# Patient Record
Sex: Female | Born: 1965 | Race: White | Hispanic: No | Smoking: Never smoker
Health system: Southern US, Community
[De-identification: ages and names within clinical notes are randomized; demographics above are authoritative.]

## PROBLEM LIST (undated history)

## (undated) DIAGNOSIS — I10 Essential (primary) hypertension: Secondary | ICD-10-CM

## (undated) DIAGNOSIS — N809 Endometriosis, unspecified: Secondary | ICD-10-CM

## (undated) DIAGNOSIS — R7303 Prediabetes: Secondary | ICD-10-CM

## (undated) DIAGNOSIS — F329 Major depressive disorder, single episode, unspecified: Secondary | ICD-10-CM

## (undated) DIAGNOSIS — G43909 Migraine, unspecified, not intractable, without status migrainosus: Secondary | ICD-10-CM

## (undated) DIAGNOSIS — E781 Pure hyperglyceridemia: Secondary | ICD-10-CM

## (undated) HISTORY — DX: Major depressive disorder, single episode, unspecified: F32.9

## (undated) HISTORY — DX: Pure hyperglyceridemia: E78.1

## (undated) HISTORY — DX: Endometriosis, unspecified: N80.9

## (undated) HISTORY — DX: Prediabetes: R73.03

---
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
## 1995-01-23 DIAGNOSIS — N809 Endometriosis, unspecified: Secondary | ICD-10-CM

## 1995-01-23 HISTORY — DX: Endometriosis, unspecified: N80.9

## 1995-01-23 HISTORY — PX: LAPAROSCOPIC ABDOMINAL EXPLORATION: SHX6249

## 1997-01-22 DIAGNOSIS — I10 Essential (primary) hypertension: Secondary | ICD-10-CM

## 1997-01-22 HISTORY — DX: Essential (primary) hypertension: I10

## 1997-03-02 ENCOUNTER — Encounter (HOSPITAL_COMMUNITY): Admission: RE | Admit: 1997-03-02 | Discharge: 1997-03-22 | Payer: Self-pay | Admitting: Obstetrics and Gynecology

## 1997-03-19 ENCOUNTER — Inpatient Hospital Stay (HOSPITAL_COMMUNITY): Admission: AD | Admit: 1997-03-19 | Discharge: 1997-03-22 | Payer: Self-pay | Admitting: Obstetrics and Gynecology

## 1997-10-26 ENCOUNTER — Other Ambulatory Visit: Admission: RE | Admit: 1997-10-26 | Discharge: 1997-10-26 | Payer: Self-pay | Admitting: Obstetrics and Gynecology

## 1998-01-22 DIAGNOSIS — I10 Essential (primary) hypertension: Secondary | ICD-10-CM | POA: Insufficient documentation

## 1998-05-14 ENCOUNTER — Emergency Department (HOSPITAL_COMMUNITY): Admission: EM | Admit: 1998-05-14 | Discharge: 1998-05-15 | Payer: Self-pay | Admitting: Emergency Medicine

## 1998-05-17 ENCOUNTER — Encounter: Payer: Self-pay | Admitting: Obstetrics and Gynecology

## 1998-05-17 ENCOUNTER — Ambulatory Visit (HOSPITAL_COMMUNITY): Admission: RE | Admit: 1998-05-17 | Discharge: 1998-05-17 | Payer: Self-pay | Admitting: Obstetrics and Gynecology

## 1998-06-17 ENCOUNTER — Ambulatory Visit (HOSPITAL_COMMUNITY): Admission: RE | Admit: 1998-06-17 | Discharge: 1998-06-17 | Payer: Self-pay | Admitting: Obstetrics and Gynecology

## 1999-01-14 ENCOUNTER — Emergency Department (HOSPITAL_COMMUNITY): Admission: EM | Admit: 1999-01-14 | Discharge: 1999-01-15 | Payer: Self-pay | Admitting: *Deleted

## 2000-01-23 HISTORY — PX: APPENDECTOMY: SHX54

## 2000-02-26 ENCOUNTER — Encounter: Admission: RE | Admit: 2000-02-26 | Discharge: 2000-02-26 | Payer: Self-pay | Admitting: *Deleted

## 2000-02-26 ENCOUNTER — Encounter: Payer: Self-pay | Admitting: *Deleted

## 2000-02-27 ENCOUNTER — Other Ambulatory Visit: Admission: RE | Admit: 2000-02-27 | Discharge: 2000-02-27 | Payer: Self-pay | Admitting: Obstetrics and Gynecology

## 2000-02-28 ENCOUNTER — Encounter (INDEPENDENT_AMBULATORY_CARE_PROVIDER_SITE_OTHER): Payer: Self-pay | Admitting: Specialist

## 2000-02-29 ENCOUNTER — Encounter: Payer: Self-pay | Admitting: Obstetrics and Gynecology

## 2000-02-29 ENCOUNTER — Inpatient Hospital Stay (HOSPITAL_COMMUNITY): Admission: AD | Admit: 2000-02-29 | Discharge: 2000-03-01 | Payer: Self-pay | Admitting: Obstetrics and Gynecology

## 2000-06-21 ENCOUNTER — Ambulatory Visit (HOSPITAL_COMMUNITY): Admission: RE | Admit: 2000-06-21 | Discharge: 2000-06-21 | Payer: Self-pay | Admitting: Obstetrics and Gynecology

## 2000-06-21 ENCOUNTER — Encounter: Payer: Self-pay | Admitting: Obstetrics and Gynecology

## 2000-08-18 ENCOUNTER — Observation Stay (HOSPITAL_COMMUNITY): Admission: EM | Admit: 2000-08-18 | Discharge: 2000-08-19 | Payer: Self-pay | Admitting: Emergency Medicine

## 2000-08-18 ENCOUNTER — Encounter: Payer: Self-pay | Admitting: Emergency Medicine

## 2001-04-10 ENCOUNTER — Encounter: Payer: Self-pay | Admitting: Emergency Medicine

## 2001-04-10 ENCOUNTER — Emergency Department (HOSPITAL_COMMUNITY): Admission: EM | Admit: 2001-04-10 | Discharge: 2001-04-10 | Payer: Self-pay | Admitting: Emergency Medicine

## 2002-03-09 ENCOUNTER — Other Ambulatory Visit: Admission: RE | Admit: 2002-03-09 | Discharge: 2002-03-09 | Payer: Self-pay | Admitting: Obstetrics and Gynecology

## 2002-04-14 ENCOUNTER — Ambulatory Visit (HOSPITAL_COMMUNITY): Admission: RE | Admit: 2002-04-14 | Discharge: 2002-04-14 | Payer: Self-pay | Admitting: Obstetrics and Gynecology

## 2002-04-28 ENCOUNTER — Encounter: Payer: Self-pay | Admitting: Obstetrics and Gynecology

## 2002-04-28 ENCOUNTER — Ambulatory Visit (HOSPITAL_COMMUNITY): Admission: RE | Admit: 2002-04-28 | Discharge: 2002-04-28 | Payer: Self-pay | Admitting: Obstetrics and Gynecology

## 2002-06-10 ENCOUNTER — Ambulatory Visit (HOSPITAL_COMMUNITY): Admission: RE | Admit: 2002-06-10 | Discharge: 2002-06-10 | Payer: Self-pay | Admitting: Obstetrics and Gynecology

## 2002-06-10 ENCOUNTER — Encounter: Payer: Self-pay | Admitting: Obstetrics and Gynecology

## 2002-07-07 ENCOUNTER — Encounter: Admission: RE | Admit: 2002-07-07 | Discharge: 2002-10-05 | Payer: Self-pay | Admitting: Obstetrics and Gynecology

## 2002-07-13 ENCOUNTER — Encounter: Payer: Self-pay | Admitting: Obstetrics and Gynecology

## 2002-07-13 ENCOUNTER — Inpatient Hospital Stay (HOSPITAL_COMMUNITY): Admission: AD | Admit: 2002-07-13 | Discharge: 2002-07-13 | Payer: Self-pay | Admitting: Obstetrics and Gynecology

## 2002-07-16 ENCOUNTER — Encounter: Admission: RE | Admit: 2002-07-16 | Discharge: 2002-07-16 | Payer: Self-pay | Admitting: Obstetrics and Gynecology

## 2002-07-23 ENCOUNTER — Encounter: Admission: RE | Admit: 2002-07-23 | Discharge: 2002-07-23 | Payer: Self-pay | Admitting: Obstetrics and Gynecology

## 2002-07-30 ENCOUNTER — Encounter: Admission: RE | Admit: 2002-07-30 | Discharge: 2002-07-30 | Payer: Self-pay | Admitting: Obstetrics and Gynecology

## 2002-08-03 ENCOUNTER — Encounter: Admission: RE | Admit: 2002-08-03 | Discharge: 2002-08-03 | Payer: Self-pay | Admitting: Obstetrics and Gynecology

## 2002-08-04 ENCOUNTER — Inpatient Hospital Stay (HOSPITAL_COMMUNITY): Admission: AD | Admit: 2002-08-04 | Discharge: 2002-08-04 | Payer: Self-pay | Admitting: Obstetrics and Gynecology

## 2002-08-05 ENCOUNTER — Inpatient Hospital Stay (HOSPITAL_COMMUNITY): Admission: AD | Admit: 2002-08-05 | Discharge: 2002-08-05 | Payer: Self-pay | Admitting: Obstetrics and Gynecology

## 2002-08-06 ENCOUNTER — Inpatient Hospital Stay (HOSPITAL_COMMUNITY): Admission: AD | Admit: 2002-08-06 | Discharge: 2002-08-11 | Payer: Self-pay | Admitting: Obstetrics and Gynecology

## 2002-08-06 ENCOUNTER — Encounter: Payer: Self-pay | Admitting: Obstetrics and Gynecology

## 2002-08-06 ENCOUNTER — Encounter: Admission: RE | Admit: 2002-08-06 | Discharge: 2002-08-06 | Payer: Self-pay | Admitting: Obstetrics and Gynecology

## 2002-08-07 ENCOUNTER — Encounter (INDEPENDENT_AMBULATORY_CARE_PROVIDER_SITE_OTHER): Payer: Self-pay | Admitting: *Deleted

## 2003-08-13 ENCOUNTER — Inpatient Hospital Stay (HOSPITAL_COMMUNITY): Admission: RE | Admit: 2003-08-13 | Discharge: 2003-08-15 | Payer: Self-pay | Admitting: Obstetrics and Gynecology

## 2003-08-13 ENCOUNTER — Encounter (INDEPENDENT_AMBULATORY_CARE_PROVIDER_SITE_OTHER): Payer: Self-pay | Admitting: *Deleted

## 2005-07-02 ENCOUNTER — Emergency Department (HOSPITAL_COMMUNITY): Admission: EM | Admit: 2005-07-02 | Discharge: 2005-07-02 | Payer: Self-pay | Admitting: Emergency Medicine

## 2008-08-31 ENCOUNTER — Encounter: Admission: RE | Admit: 2008-08-31 | Discharge: 2008-08-31 | Payer: Self-pay | Admitting: Gynecology

## 2009-06-01 ENCOUNTER — Ambulatory Visit (HOSPITAL_COMMUNITY): Admission: RE | Admit: 2009-06-01 | Discharge: 2009-06-01 | Payer: Self-pay | Admitting: Chiropractic Medicine

## 2009-07-05 ENCOUNTER — Emergency Department (HOSPITAL_COMMUNITY): Admission: EM | Admit: 2009-07-05 | Discharge: 2009-07-05 | Payer: Self-pay | Admitting: Emergency Medicine

## 2010-03-15 ENCOUNTER — Other Ambulatory Visit: Payer: Self-pay | Admitting: Gynecology

## 2010-03-15 DIAGNOSIS — Z1231 Encounter for screening mammogram for malignant neoplasm of breast: Secondary | ICD-10-CM

## 2010-03-23 ENCOUNTER — Ambulatory Visit
Admission: RE | Admit: 2010-03-23 | Discharge: 2010-03-23 | Disposition: A | Discharge status: Home / Self Care | Payer: BC Managed Care – PPO | Source: Ambulatory Visit | Attending: Gynecology | Admitting: Gynecology

## 2010-03-23 ENCOUNTER — Other Ambulatory Visit: Payer: Self-pay | Admitting: Gynecology

## 2010-03-23 DIAGNOSIS — Z1231 Encounter for screening mammogram for malignant neoplasm of breast: Secondary | ICD-10-CM

## 2010-04-10 LAB — CBC
HCT: 34.1 % — ABNORMAL LOW (ref 36.0–46.0)
Hemoglobin: 12.1 g/dL (ref 12.0–15.0)
MCHC: 35.3 g/dL (ref 30.0–36.0)
MCV: 85.8 fL (ref 78.0–100.0)
Platelets: 320 10*3/uL (ref 150–400)
RBC: 3.98 MIL/uL (ref 3.87–5.11)
WBC: 5.5 10*3/uL (ref 4.0–10.5)

## 2010-04-10 LAB — POCT CARDIAC MARKERS
CKMB, poc: <1 ng/mL — ABNORMAL LOW (ref 1.0–8.0)
Troponin i, poc: <0.05 ng/mL (ref 0.00–0.09)
Troponin i, poc: <0.05 ng/mL (ref 0.00–0.09)

## 2010-04-10 LAB — BASIC METABOLIC PANEL
BUN: 10 mg/dL (ref 6–23)
CO2: 27 mEq/L (ref 19–32)
GFR calc non Af Amer: >60 mL/min (ref >60)
Glucose, Bld: 98 mg/dL (ref 70–99)
Sodium: 143 mEq/L (ref 135–145)

## 2010-04-10 LAB — DIFFERENTIAL
Basophils Relative: 1 % (ref 0–1)
Monocytes Absolute: 0.4 10*3/uL (ref 0.1–1.0)
Neutro Abs: 3.6 10*3/uL (ref 1.7–7.7)

## 2010-06-09 NOTE — Discharge Summary (Signed)
NAME:  [name] [name], [name] [name]                            ACCOUNT NO.:  [id number]   MEDICAL RECORD NO.:  [id number]                   PATIENT TYPE:  INP   LOCATION:  9311                                 FACILITY:  [facility]   PHYSICIAN:  Zenaida Niece, M.D.             DATE OF BIRTH:  1965/06/29   DATE OF ADMISSION:  08/13/2003  DATE OF DISCHARGE:  08/15/2003                                 DISCHARGE SUMMARY   ADMISSION DIAGNOSES:  1. Pelvic pain.  2. Endometriosis.   DISCHARGE DIAGNOSES:  1. Pelvic pain.  2. Endometriosis.   PROCEDURES:  On August 13, 2003 she had a TAH/BSO.   HISTORY AND PHYSICAL:  Briefly, this is a 45 year old white female para 2-0-  1-2 with recurrent pelvic pain.  She has a history of endometriosis.  She  wishes to undergo definitive surgical therapy and is admitted for this at  this time.  Past history is significant for two cesarean sections, chronic  hypertension, and migraine headaches.  Physical exam significant for a  benign abdomen with tender bilateral lower quadrants and a well-healed  transverse scar.  Pelvic exam reveals a small anteverted uterus that is  slightly tender and no adnexal masses although she has bilateral adnexal  tenderness.   HOSPITAL COURSE:  The patient was admitted on the day of surgery and  underwent a TAH/BSO under epidural anesthesia.  Estimated blood loss was 100  mL.  She did have evidence of prior tubal ligation and evidence of  endometriosis on posterior uterus and right uterosacral ligament.  Omentum  was also adherent to the anterior abdominal wall.  Postoperatively she had  nausea and vomiting which resolved with medications.  Preoperative  hemoglobin 13.1, postoperative 9.6.  On postoperative day #2 she was felt to  be stable enough for discharge home.   DISCHARGE INSTRUCTIONS:  Regular diet, pelvic rest, no strenuous activity.  Follow-up is in 2-3 days for staple removal.   Medications are:  1. Percocet #40 one to two p.o.  q.4-6h. p.r.n. pain.  2. Climara patch 0.1 mg weekly.  3. Valium 5 mg #10 one p.o. p.r.n. back spasms.                                               Zenaida Niece, M.D.    TDM/MEDQ  D:  08/15/2003  T:  08/15/2003  Job:  [id number]

## 2010-06-09 NOTE — Discharge Summary (Signed)
NAME:  [name] [name], [name] [name]                            ACCOUNT NO.:  [id number]   MEDICAL RECORD NO.:  [id number]                   PATIENT TYPE:  INP   LOCATION:  9131                                 [facility]   PHYSICIAN:  Zenaida Niece, M.D.             DATE OF BIRTH:  [birth year]   DATE OF ADMISSION:  08/06/2002  DATE OF DISCHARGE:  08/11/2002                                 DISCHARGE SUMMARY   ADMISSION DIAGNOSES:  1. Intrauterine pregnancy at 24 weeks.  2. Preeclampsia.   DISCHARGE DIAGNOSES:  1. Intrauterine pregnancy at 32 weeks.  2. Severe preeclampsia.  3. Previous cesarean section.  4. Desires surgical sterility.   PROCEDURE:  On July 16 she underwent a repeat low transverse cesarean  section and bilateral partial salpingectomy.   HISTORY AND PHYSICAL:  This is a 45 year old white female gravida 3, para 1-  0-1-1 with an EGA of 32+ weeks who presented for admission due to ongoing  evaluation for preeclampsia with a nonreactive nonstress test on the day of  admission.  She also had an increasing headache with some improvement after  Tylenol, but no other symptoms.  She does have a history of chronic  hypertension treated initially with nothing and then put on Aldomet during  the pregnancy.  She also had paroxysmal atrial tachycardia followed by Doylene Canning. Ladona Ridgel, M.D. and treated with Toprol.  She had normal ultrasounds for  growth due to being on a beta blocker.  Ultrasound on the day of admission  revealed an AFI of 19 and an estimated fetal weight in the 25th-50th  percentile.  Pregnancy also complicated by class A1 gestational diabetes  controlled well with diet per the patient; advanced maternal age for which  she declined amniocentesis and had a 1 in 65 risk of Down syndrome by  triple screen.  She had a previous cesarean section for macrosomia.  Baby  weighed 9 pounds 10 ounces and she is to have a repeat cesarean section.   PRENATAL LABORATORIES:  Blood  type is O+ with a negative antibody screen.  RPR nonreactive.  Rubella immune.  Hepatitis B surface antigen negative.  HIV negative.  Gonorrhea and Chlamydia negative.   PAST MEDICAL HISTORY:  1. Chronic hypertension.  2. Migraine headaches.   PAST OBSTETRICAL HISTORY:  In 1999 cesarean section at 38 weeks for  macrosomia 9 pounds 10 ounces.   PAST GYN HISTORY:  History of endometriosis and a laparoscopy for an  adhesion of the uterus to the abdominal wall and a history of cryo therapy  as well as an inguinal hernia repair and a cesarean section.   CURRENT MEDICATIONS:  1. Aldomet 500 mg p.o. b.i.d.  2. Toprol 25 mg p.o. daily.   ALLERGIES:  None.   PHYSICAL EXAMINATION:  VITAL SIGNS:  She was afebrile with blood pressure  150/100.  Fetal heart tracing initially was nonreactive  and on labor and  delivery it was reassuring after IV hydration.  ABDOMEN:  Gravid and nontender with a well healed transverse scar.  PELVIC:  Cervix is long and closed.  EXTREMITIES:  DTRs are 1+ and no clonus.   ADMISSION LABORATORIES:  White count 19.5, hemoglobin 11.3, platelet count  376,000.  Urine protein greater than 300 mg percent.  Remainder of her  laboratories are normal.   HOSPITAL COURSE:  The patient was initially admitted for observation for  nonreassuring/nonreactive fetal heart tracing.  She was also noted to repeat  a 24-hour urine collection and watch her blood pressures.  She had repeat  laboratories drawn on the morning of July 16 which were still completely  stable.  However, over the night on the 15th her headache became worse.  She  took Percocet and Tylenol with no relief of her headache.  Blood pressures  were 120s-160s/80s-100.  Fetal heart tracing on the morning of 16th still  had decreased variability, was nonreactive with an occasional variable  deceleration.  She had significant edema and reflexes were okay.  The  decision was made at that time to go ahead and proceed  with delivery.  The  patient had received two shots of betamethasone earlier in the week in  preparation for possible early delivery.  On the morning of July 16 she  underwent a repeat low transverse cesarean section with tubal ligation under  spinal anesthesia.  Estimated blood loss was 800 mL.  She delivered a viable  female infant with Apgars of 7 and 8 that weighed 4 pounds 1 ounce and had a  cord arterial pH of 7.27.  Postoperatively she was continued on magnesium  sulfate which she had been started on on the morning of July 16 prior to  cesarean section.  On the evening of surgery she was doing well.  She did  have a few crackles in her lung bases and had some decreased urine output so  she was given one dose of IV Lasix.  This caused a fair diuresis.  On the  morning of July 17 she still did not have a good spontaneous urine output.  It was adequate, but not diuresis expected after delivery.  Pre delivery  hemoglobin 10.1, post delivery 9.9.  She was kept on magnesium through the  morning of July 17.  That afternoon her urine output increased and her  magnesium was stopped.  Blood pressures remained stable 140s-160s/80s.  She  was then sent to the floor.  On the morning of July 19, postoperative day #3  she had a migraine headache which was different from the headache of  preeclampsia that she had on admission.  This was treated successfully with  Imitrex.  On the evening of July 18 she was also started on Procardia XL 30  mg daily for a slightly elevated blood pressure persistently in the 150s-  160s/90s-100.  On the morning of July 20, postoperative day #4 blood  pressure was stable at 140/90.  Her headache was gone.  She was afebrile.  She was felt to be stable enough for discharge home.  Baby was doing well in  the NICU off the ventilator.  Her incision was healing well and at this point her staples were removed and Steri-Strips applied.   DISCHARGE INSTRUCTIONS:  Regular diet.   Pelvic rest.  No strenuous activity.  Follow-up is in 10 days for an incision check.  She is given our discharge  pamphlet.  DISCHARGE MEDICATIONS:  1. Percocet #30 one to two p.o. q.4-6h. p.r.n. pain.  2. Procardia XL one p.o. daily for blood pressure.                                               Zenaida Niece, M.D.    TDM/MEDQ  D:  08/11/2002  T:  08/11/2002  Job:  [id number]

## 2010-06-09 NOTE — Op Note (Signed)
NAME:  [name] [name], [name] [name]                            ACCOUNT NO.:  [id number]   MEDICAL RECORD NO.:  [id number]                   PATIENT TYPE:  INP   LOCATION:  9399                                 [facility]ILITY:  [facility]   PHYSICIAN:  Zenaida Niece, M.D.             DATE OF BIRTH:  09/24/1965   DATE OF PROCEDURE:  08/13/2003  DATE OF DISCHARGE:                                 OPERATIVE REPORT   PREOPERATIVE DIAGNOSES:  Pelvic pain and endometriosis.   POSTOPERATIVE DIAGNOSES:  Pelvic pain and endometriosis.   PROCEDURE:  Total abdominal hysterectomy with bilateral salpingo-  oophorectomy.   SURGEON:  Zenaida Niece, M.D.   ASSISTANT:  Malachi Pro. Ambrose Mantle, M.D.   ANESTHESIA:  Epidural.   ESTIMATED BLOOD LOSS:  100 mL.   FINDINGS:  Normal size uterus with normal tubes and ovaries with evidence of  prior tubal ligation. The omentum was adherent to the anterior abdominal  wall. She had evidence of endometriosis on the right uterosacral ligament.   SPECIMENS:  Uterus with tubes and ovaries.   DESCRIPTION OF PROCEDURE:  The patient was taken to the operating room and  placed in the dorsal supine position. She then rolled to her side and Dr.  Jean Rosenthal instilled an epidural for anesthesia.  She was placed back in the  dorsal supine position. Abdomen and perineum and vagina were prepped and  draped in the usual sterile fashion and a Foley catheter inserted.  The  level of her anesthesia was found to be adequate and her abdomen was entered  via her previous Pfannenstiel incision.  She did have some sensitivity  superiorly and this was taken care of with local anesthesia.  A self  retaining retractor was placed and bowels packed out of the pelvis. An  omental adhesion to the anterior abdominal wall was taken down with  electrocautery to allow the omentum to be packed up as well.  The uterus was  identified and found to be essentially normal. Both tubes and ovaries were  normal with evidence of  prior tubal ligation.  Endometriosis along the right  uterosacral ligament was fulgurated with electrocautery. Uterine cornu were  grasped with long Kelly clamps. The round ligaments were taken down with  electrocautery. The infundibulopelvic ligaments were clamped, transected and  doubly ligated with #1 chromic. The anterior peritoneum was incised across  the anterior portion of the uterus and the bladder pushed inferior.  The  uterine arteries were skeletonized and then clamped, transected and ligated  on each side with #1 chromic.  The bladder was pushed further inferior and  cardinal ligaments, uterosacral ligaments, and vagina angles were clamped,  transected and ligated with #1 chromic. The vagina was entered with the last  pedicle and the cervix and uterus were removed sharply. The cervix was  intact. The remainder of the vaginal cuff was closed with interrupted  sutures of #1 chromic.  All pedicles were inspected. Small bleeders  controlled with electrocautery and 3-0 Vicryl.  The bladder appeared to be  well inferior. The uterosacral ligaments were approximated in the midline  with 2-0 silk. One of the previously placed uterosacral ligament sutures was  also tied to reapproximate the uterosacral ligaments.  All pedicles were  again inspected and found to be hemostatic. The ureters were palpated on  each side well below in the incision line.  Subfascial space was then  irrigated and made hemostatic with electrocautery. She was again sensitive  superiorly and local anesthesia was poured into the incision superiorly to  aid in closure. The fascia was closed in a running fashion starting at both  ends and meeting in the middle with #0 Vicryl. The subcutaneous tissue was  then closed with running 2-0 plain gut suture. The skin was closed with  staples. The patient tolerated the procedure well and was taken to the  recovery room in stable condition. Counts were correct, she received Ancef  1  g prior to the prior, she had PAS hose on throughout the procedure.                                               Zenaida Niece, M.D.    TDM/MEDQ  D:  08/13/2003  T:  08/13/2003  Job:  [id number]

## 2010-06-09 NOTE — Op Note (Signed)
NAME:  CADINCE, HILSCHER                            ACCOUNT NO.:  [id number]   MEDICAL RECORD NO.:  [id number]                   PATIENT TYPE:  INP   LOCATION:  9374                                 [facility]ILITY[facility]   PHYSICIAN:  Zenaida Niece, M.D.             DATE OF BIRTH:  11-30-1965   DATE OF PROCEDURE:  08/07/2002  DATE OF DISCHARGE:                                 OPERATIVE REPORT   PREOPERATIVE DIAGNOSIS:  Intrauterine pregnancy at 32 plus weeks, severe pre-  eclampsia, previous cesarean section, desires surgical sterility.   POSTOPERATIVE DIAGNOSIS:  Intrauterine pregnancy at 32 plus weeks, severe  pre-eclampsia, previous cesarean section, desires surgical sterility.   PROCEDURE:  Repeat low transverse cesarean section, bilateral partial  salpingectomy.   SURGEON:  Zenaida Niece, M.D.   ANESTHESIA:  Spinal.   ESTIMATED BLOOD LOSS:  800 mL.   FINDINGS:  The patient had normal anatomy and delivered a viable female infant  with Apgars 7 and 8, that weighed 1856 grams and had an arterial cord pH of  7.27.   PROCEDURE IN DETAIL:  The patient was taken to the operating room and placed  in a sitting position.  Dr. Tacy Dura instilled spinal anesthesia and she was  placed in the dorsal supine position with a left lateral tilt.  Her abdomen  was then prepped and draped in the usual sterile fashion and a Foley  catheter inserted.  The level of her anesthesia was found to be adequate and  her abdomen was entered via her previous Pfannenstiel incision.  The  vesicouterine peritoneum was incised and a bladder flap created digitally.  A 4 cm transverse incision was made in the lower uterine segment which was  fairly thin.  The uterine incision was extended bilaterally digitally and  for a short distance with bandage scissors.  Membranes were ruptured with  return of clear fluid.  The fetal vertex was grasped and delivered through  the incision atraumatically.  The mouth and nares  were suctioned.  The  remainder of the infant delivered atraumatically.  The cord was doubly  clamped and cut and the infant handed to the awaiting pediatric team.  Cord  blood and cord gas were obtained.  The placenta delivered spontaneously.  The uterus was wiped out with a clean lap pad and all clots and debris were  removed.  The uterine incision was inspected and found to be free of  extensions.  The uterine incision was closed in one layer of running locking  layer with #1 chromic with adequate hemostasis.  Both tubes and ovaries were  inspected and found to be normal.   Attention was turned to tubal ligation.  Both fallopian tubes were  identified and traced to their fimbriated ends.  A knuckle of tube was  elevated on each side with a Babcock clamp.  A window was made in an  avascular  portion of the mesosalpinx with electrocautery.  This segment of  tube was then tied with 0 plain gut suture.  The knuckle of tube was removed  sharply on each side.  On both sides, both ostia were identified and the  stumps were hemostatic.  The uterine incision was again inspected and found  to be hemostatic.  The subfascial space was irrigated and rendered  hemostatic with electrocautery.  The rectus muscles were closed with running  #1 chromic.  The fascia was closed with running 0 Vicryl starting at both  ends and meeting in the middle.  The subcutaneous tissue was irrigated and  made hemostatic with electrocautery and then closed with running 2-0 plain  gut suture.  The skin was closed  with staples and a sterile dressing.  The patient tolerated the procedure  well and was taken to the recovery room in stable condition.  The baby was  doing well in the NICU.  Counts were correct x 2.  She was given Ancef 1  gram after cord clamp.                                               Zenaida Niece, M.D.    TDM/MEDQ  D:  08/07/2002  T:  08/08/2002  Job:  [id number]

## 2010-06-09 NOTE — H&P (Signed)
NAME:  [name] [name], [name] [name]                            ACCOUNT NO.:  [id number]   MEDICAL RECORD NO.:  [id number]                   PATIENT TYPE:  INP   LOCATION:  NA                                   [facility]   PHYSICIAN:  Zenaida Niece, M.D.             DATE OF BIRTH:  01-08-1966   DATE OF ADMISSION:  DATE OF DISCHARGE:                                HISTORY & PHYSICAL   ANTICIPATED DATE OF ADMISSION:  August 13, 2003.   CHIEF COMPLAINT:  Pelvic pain and a history of endometriosis.   HISTORY OF PRESENT ILLNESS:  This is a 45 year old white female, para 2, 0,  1, 2, who I saw for recurrent pelvic pain on June 27th.  The patient has  been on Procardia and hydrochlorothiazide for hypertension.  She has had  slightly irregular long menses, which are very painful.  She initial tried  to use the Ortho Evra patch, but forgot it, so she was not using it.  The  pain is bilateral and in her pelvis.  This is similar to the pain that she  has had before that has been associated with endometriosis and significant  adhesions.  She also feels depressed and had tried Lexapro for this, but  this made her tired.  On physical exam she had a benign abdomen that was  tender in both lower quadrants and a well healed transverse scar.  On pelvic  exam she had positive cervical motion tenderness and a slightly tender small  anteverted uterus with tender bilateral adnexa without masses.  At that time  we discussed all options for pelvic pain with possible recurrent  endometriosis and possible adhesions.  She was also started on Wellbutrin  for her symptoms of depression.  The patient elected to undergo definitive  surgical therapy and is admitted for hysterectomy.   PAST OBSTETRICAL HISTORY:  The patient has had one spontaneous miscarriage,  and two cesarean section; one at 38 weeks in 1999 for a baby that weighed 9  pounds 10 ounces and most recent cesarean section in July 2004 was at 32  weeks for  severe preeclampsia.  She also had a tubal ligation with that  surgery.  During her last pregnancy she did have episodes of atrial  tachycardia, which were followed by Dr. Lewayne Bunting of [place] and  spontaneously resolved.   PAST MEDICAL HISTORY:  Chronic hypertension and migraine headaches.  She  recently saw Dr. Marisue Brooklyn who changed her hypertensive medications to  Micardis and she is following her for this.   PAST SURGICAL HISTORY:  Significant for the two cesarean sections as well as  bilateral inguinal hernia repair and laparoscopy times two with fulguration  of endometriosis and adhesiolysis.   PAST GYNECOLOGICAL HISTORY:  Past Gyn history is significant for the history  of endometriosis and a history of cryotherapy.   ALLERGIES:  None known.  MEDICATIONS:  Current medications are Micardis 80 mg p.o. daily and Imitrex  p.r.n.   SOCIAL HISTORY:  Patient is married and denies alcohol, tobacco or drug use.   REVIEW OF SYSTEMS:  Review of systems is otherwise negative.   FAMILY HISTORY:  Family history is noncontributory.   PHYSICAL EXAMINATION:  GENERAL APPEARANCE:  This is a well-developed, well-  nourished white female in no acute distress.  Weight is approximately 150  pounds.  NECK:  Neck is supple without lymphadenopathy or thyromegaly.  LUNGS:  Lungs are clear to auscultation.  HEART:  Regular rate and rhythm without murmur.  ABDOMEN:  Abdomen is soft with tender bilateral lower quadrants.  She has no  palpable masses and she does have a well-healed transverse scar.  EXTREMITIES:  The extremities have no edema.  PELVIC:  Pelvic exam reveals a small anteverted uterus that is slightly  tender with no adnexal masses.   LABORATORY DATA:  Preop urinalysis reveals 1+ esterase, 1+ protein, 3+ blood  and on micro there are 10-15 white cells, 0-3 red cells, 10-15 epithelials,  and moderate mucous threads and a few bacteria, but the urine culture  reveals  50,000-100,000 ACFUs of mixed urogenital flora.   ASSESSMENT:  Chronic recurrent pelvic pain with a history of endometriosis.   The patient has had medical and surgical therapy in the past, and wishes to  undergo definitive surgical therapy.  She understands the risks of surgery  including bleeding, infection and/or damage to surrounding organs as well as  the risk of permanent sterility.  The patient understands these risks and  wishes to proceed.   PLAN:  Admit the patient on the day of surgery for a total abdominal  hysterectomy with bilateral salpingo-oophorectomy.                                               Zenaida Niece, M.D.    TDM/MEDQ  D:  08/12/2003  T:  08/13/2003  Job:  [id number]

## 2010-06-09 NOTE — Discharge Summary (Signed)
[place]. [facility]  Patient:    [name], [name]                           MRN: [id number] Adm. Date:  08/18/00 Disc. Date: 08/19/00 Attending:  Madolyn Frieze. Jens Som, M.D. [facility] Dictator:   Gene Serpe, P.A.                  Referring Physician Discharge [facility]  PROCEDURES:  A 2-D echocardiogram.  REASON FOR ADMISSION:  The patient is a 45 year old female with no prior history of heart disease and cardiac risk factors notable for hypertension who presented with new-onset substernal chest discomfort associated with dyspnea. She noted exacerbation of symptoms when lying flat; she reported no change with exertion and no association with eating.  PAST MEDICAL HISTORY: 1. Hypertension. 2. Migraine headache - treated with Imitrex. 3. Endometriosis. 4. Gestational diabetes. 5. Status post appendectomy. 6. Status post C section.  LABORATORY DATA:  CBC notable for elevated platelets at 500, otherwise normal.  D-dimer less than 0.22.  Normal electrolytes, renal function, and liver enzymes.  Cardiac enzymes:  CPK/MB negative x 3, troponin I 0.02 (x 2). Urinalysis negative.  ESR pending.  Admission CXR:  Negative.  HOSPITAL COURSE:  Patient ruled out for an MI with negative serial cardiac enzymes.  She was placed on Indocin and p.r.n. morphine for management of probable musculoskeletal chest pain.  A preliminary review of 2-D echocardiogram performed on day of discharge revealed no significant abnormalities.  Patient was cleared for discharge with plans to proceed with outpatient stress-only Cardiolite due to cardiac risk factors.  She will be placed on a two-week trial on nonsteroidals.  DISCHARGE MEDICATIONS:  Indocin 50 mg t.i.d. p.r.n.  INSTRUCTIONS:  Patient is scheduled for a stress-only Cardiolite at [facility] on Thursday, August 8 at 1:15 p.m.  Patient will follow-up with Dr. D[facility]l [facility] on Tuesday, August 20 at 11  a.m.  DISCHARGE DIAGNOSES: 1. Noncardiac chest pain - question musculoskeletal. 2. History of hypertension. 3. Migraine headaches. DD:  08/19/00 TD:  08/19/00 Job: [id number]

## 2010-06-09 NOTE — Consult Note (Signed)
[facility]  Patient:    [name] [name], [name] [name]                         MRN: [id number] Adm. Date:  [id number] Attending:  Oliver Pila                          Consultation Report  INTRAOPERATIVE CONSULT  HISTORY:                      [name] [name] is a 45 year old white female who presented February 28, 2000 with nausea, vomiting, and right-sided abdominal pain. She was initially evaluated by Dr. Maple Hudson and was felt at that time not to have appendicitis, given her lack of fever and normal white count. She continued to have pain with nausea and vomiting and was admitted to OB/GYN service at [facility] with plans to laparoscope her for endometriosis since she was felt to have endometriosis causing at least some of her pain and a history of endometriosis. At the time of operation, she had an unusual appearing appendix and surgery was consulted to come evaluate this.  PAST MEDICAL HISTORY:         Significant for endometriosis.  PAST SURGICAL HISTORY:        Significant for prior laparoscopy for endometriosis.  MEDICATIONS:                  Vicoprofen, Cipro, Metronidazole.  ALLERGIES:                    No known drug allergies.  FAMILY HISTORY:               Unavailable at the time.  PHYSICAL EXAMINATION:         At the time of the examination, she was under general anesthesia and the laparoscopy was underway. The appearance of her gallbladder was very unusual. It was not grossly enlarged, but it had what appeared to be a narrowed kink in its midsection. There was little inflammation associated with it and certain no purulence noted free in the abdomen, but with the right lower quadrant pain and generalized kinked and narrowed appearance of the midbody of the appendix. It was felt that it would be best to remove this at the time of her surgery. He operative report will be dictated separately.  We will plan to remove her appendix during the  operation and will follow her while she is here in the hospital. DD:  02/29/00 TD:  02/29/00 Job: [id number]

## 2010-06-09 NOTE — Discharge Summary (Signed)
[facility]  Patient:    [name] [name], [name] [name]                         MRN: [id number] Adm. Date:  [id number] Disc. Date: [id number] Attending:  Oliver Pila CC:         Chevis Pretty, M.D.   Discharge Summary  ADMISSION DIAGNOSIS:          Pelvic pain.  DISCHARGE DIAGNOSES:          1. Pelvic pain.                               2. Endometriosis.                               3. Endometriosis involving the appendix.  PROCEDURES:                   1. Laparoscopic fulguration of endometriosis.                               2. Laparoscopic appendectomy.  COMPLICATIONS:                None.  CONSULTATIONS:                Dr. Chevis Pretty.  HISTORY AND PHYSICAL:         Briefly, this is a 45 year old white female, gravida 1, para 1-0-0-1, who was scheduled for a laparoscopy on February 8 for pelvic pain. However, she presented on February 6 with increasing pelvic pain and significant nausea and vomiting. She has a known right ovarian cyst by a CT scan and had been previously evaluated by Dr. Francina Ames who did not feel she had appendicitis. She has had nausea and vomiting off and on and her pain is more on her right side.  PAST OBSTETRIC HISTORY:       One cesarean section at term without complications.  PAST MEDICAL HISTORY:         Migraine headaches.  PAST SURGICAL HISTORY:        She had a laparoscopy approximately 18 months ago for pelvic adhesions and endometriosis.  CURRENT MEDICATIONS:          Vicoprofen, Metronidazole, and Cipro.  PHYSICAL EXAMINATION:  VITAL SIGNS:                  She was afebrile with a temperature of 97.3. Pulse was 114. Blood pressure 137/95.  GENERAL:                      She is slightly uncomfortable talking and smiling.  ABDOMEN:                      Soft, tender in the right lower quadrant without rebound or guarding.  PELVIC:                       Normal size uterus and she had a tender right adnexa with  minimal fullness.  ADMISSION LABORATORIES:       White count of 3. Hemoglobin 14. Platelet count of 378,000 with a normal differential. Negative urinalysis.  HOSPITAL COURSE:  The patient was admitted for pain control and to rule out appendicitis. She was given IV Dilaudid and Phenergan. She had several episodes of emesis for 60 to 90 minutes, which was also treated with Zofran. On the morning of February 7, she had a flat and upright x-rays of her abdomen which were normal. She also had a pelvic ultrasound which revealed a normal uterus, normal ovaries with some fluid around the right ovary but no ovarian cyst. As her pain was not abating, we elected to proceed with laparoscopy that evening. On the evening of February 7, she underwent a laparoscopy which revealed extensive endometriosis which may have been involving the appendix. The appendix was bent upon itself. The endometriosis was fulgurated and Dr. Chevis Pretty was called and performed a laparoscopic appendectomy.  She did well after surgery, remained afebrile, and was able to tolerate a diet. She did have a migraine headache treated with Imitrex. On the evening of February 8, she was felt to be stable enough for discharge home.  CONDITION ON DISCHARGE:       Stable.  DISPOSITION:                  Discharge to home.  DISCHARGE INSTRUCTIONS:       Her diet is regular. Her activity is no strenuous activity.  FOLLOWUP:                     In two weeks with Dr. Jackelyn Knife and Dr. Carolynne Edouard.  DISCHARGE MEDICATIONS:        Percocet p.r.n. pain. DD:  03/01/00 TD:  03/02/00 Job: [id number]

## 2010-06-09 NOTE — Op Note (Signed)
[facility]  Patient:    [name] [name], [name] [name]                         MRN: [id number] Proc. Date: 02/29/00 Adm. Date:  [id number] Disc. Date: [id number] Attending:  Oliver Pila                           Operative Report  PREOPERATIVE DIAGNOSIS:       Pelvic pain.  POSTOPERATIVE DIAGNOSES:      1. Pelvic pain.                               2. Endometriosis.  PROCEDURE:                    Laparoscopic fulguration of endometriosis.  SURGEON:                      Zenaida Niece, M.D.  ASSISTANT:                    Alvino Chapel, M.D.  FINDINGS:                     She had a slightly enlarged uterus with evidence of her previous scar from her previous laparoscopy. She had multiple implants of endometriosis throughout the pelvis. The appendix also appeared to be involved and had a stricture. An intraoperative consult was obtained by Dr. Chevis Pretty who did a laparoscopic appendectomy.  COUNTS:                       Correct.  CONDITION:                    Stable.  ANESTHESIA:                   General.  PROCEDURE IN DETAIL:          After appropriate informed consent was obtained, the patient was taken to the operating room and placed in the dorsal supine position. General anesthesia was induced and she was placed in mobile stirrups. Her abdomen was then prepped and draped in the usual sterile fashion for a laparoscopic procedure, her bladder was drained with a red rubber catheter, and a Hulka tenaculum was applied to her cervix for uterine manipulation. Her infraumbilical skin was then infiltrated with 0.25% Marcaine and a 1.5 cm horizontal incision was made. The 11 mm disposable trocar was then introduced and CO2 gas was insufflated. Placement was confirmed by the laparoscope. Inspection revealed endometrial implants along both pelvic sidewalls including the lower abdominal sidewalls as well as multiple implants in the posterior  cul-de-sac and both ovarian fossae. As many implants as I could see were coagulated with bipolar cautery. I was careful in the ovarian fossa to avoid the ureters which were fairly easily identified. Again, I was able to coagulate most of these implants. Her tubes and ovaries essentially appeared normal. Again, the appendix appeared to be folded upon itself and may have been involved with endometriosis. Dr. Chevis Pretty was called and consulted, and came and performed a laparoscopic appendectomy. Once he was done, the trocars were removed. I had placed a 5 mm port in the midline two fingerbreadths above the pubic symphysis to aid in manipulation.  The skin was anesthetized prior to insertion of the trocar with 0.25% Marcaine. All trocars were removed and all gas allowed to deflate from the abdomen. The fascia was closed with 0 Vicryl. Subcutaneous tissue was closed with 4-0 Vicryl followed by Steri-Strips and Band-aids. The Hulka tenaculum was then removed from the cervix. The patient was extubated in the operating room, tolerated the procedure well, and was taken to the recovery room in stable condition. DD:  03/02/00 TD:  03/03/00 Job: [id number]

## 2010-06-09 NOTE — Op Note (Signed)
[facility]  Patient:    [name] [name], [name] [name]                         MRN: [id number] Proc. Date: 02/29/00 Adm. Date:  [id number] Disc. Date: [id number] Attending:  Oliver Pila                           Operative Report  PREOPERATIVE DIAGNOSIS:       Endometriosis.  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  OPERATION:                    Laparoscopic appendectomy.  SURGEON:                      Chevis Pretty, M.D.  ASSISTANT:                    Alvino Chapel, M.D.  ANESTHESIA:                   General endotracheal.  DESCRIPTION OF PROCEDURE:     At the time of the consultation for this evaluation, the patient was already under general anesthesia.  She was undergoing a laparoscopy for endometriosis.  On arrival to the operating room, and laparoscopy was underway, the appearance of her gallbladder by laparoscopy was one of a narrowed and kinked mid section of the appendix.  It was felt given her symptomatology, that this would be best removed during this particular operation.  A Hasson infraumbilical port was already in place as well as a suprapubic 5 mm port.  A small vertically oriented incision was placed between these two, and a 12 mm port was placed through this incision and into the abdominal cavity under direct vision.  A blunt grasper was placed through the 5 mm port and used to elevate the appendix, and a harmonic scalpel was placed through the 12 mm port and used to take down the mesoappendix. Once this was complete, an Endo GIA was placed through the 12 mm port across the base of the appendix and fired.  The Endo GIA was then released and removed, and the base of the appendix and cecum were examined and were complete hemostatic.  Their staple line was perfectly intact.  A laparoscopic bag was then placed through the 12 mm port, and the appendix was placed within the bag.  The abdomen was then  irrigated with copious amounts of saline, and the appendix was then removed through the 12 mm port.  The 12 mm incision was closed with a single 4-0 Vicryl interrupted subcuticular stitch.  The patient tolerated the procedure well.  At the end of the case, all sponge, needle and instrument counts were correct.  The patient was awakened and taken to the recovery room in stable condition. DD:  02/29/00 TD:  03/02/00 Job: [id number]

## 2010-06-27 ENCOUNTER — Ambulatory Visit: Payer: BC Managed Care – PPO | Admitting: Psychology

## 2010-06-27 DIAGNOSIS — F4323 Adjustment disorder with mixed anxiety and depressed mood: Secondary | ICD-10-CM

## 2010-07-07 ENCOUNTER — Ambulatory Visit: Payer: BC Managed Care – PPO | Admitting: Psychology

## 2010-07-26 ENCOUNTER — Emergency Department (HOSPITAL_COMMUNITY)
Admission: EM | Admit: 2010-07-26 | Discharge: 2010-07-27 | Disposition: A | Discharge status: Home / Self Care | Payer: BC Managed Care – PPO | Attending: Emergency Medicine | Admitting: Emergency Medicine

## 2010-07-26 ENCOUNTER — Emergency Department (HOSPITAL_COMMUNITY): Payer: BC Managed Care – PPO

## 2010-07-26 DIAGNOSIS — R079 Chest pain, unspecified: Secondary | ICD-10-CM | POA: Insufficient documentation

## 2010-07-26 DIAGNOSIS — Z8249 Family history of ischemic heart disease and other diseases of the circulatory system: Secondary | ICD-10-CM | POA: Insufficient documentation

## 2010-07-26 DIAGNOSIS — R51 Headache: Secondary | ICD-10-CM | POA: Insufficient documentation

## 2010-07-26 DIAGNOSIS — F411 Generalized anxiety disorder: Secondary | ICD-10-CM | POA: Insufficient documentation

## 2010-07-26 DIAGNOSIS — Z79899 Other long term (current) drug therapy: Secondary | ICD-10-CM | POA: Insufficient documentation

## 2010-07-26 HISTORY — DX: Essential (primary) hypertension: I10

## 2010-07-26 LAB — URINALYSIS, ROUTINE W REFLEX MICROSCOPIC
Bilirubin Urine: NEGATIVE
Glucose, UA: NEGATIVE mg/dL
Hgb urine dipstick: NEGATIVE
Ketones, ur: NEGATIVE mg/dL
Leukocytes, UA: NEGATIVE
Nitrite: NEGATIVE
Specific Gravity, Urine: 1.013 (ref 1.005–1.030)
pH: 7.5 (ref 5.0–8.0)

## 2010-07-26 LAB — TROPONIN I
Troponin I: <0.3 ng/mL (ref <0.30)
Troponin I: <0.3 ng/mL (ref <0.30)

## 2010-07-26 LAB — CBC
MCH: 29.7 pg (ref 26.0–34.0)
MCHC: 36 g/dL (ref 30.0–36.0)

## 2010-07-26 LAB — CK TOTAL AND CKMB (NOT AT ARMC)
CK, MB: 1.5 ng/mL (ref 0.3–4.0)
CK, MB: 1.4 ng/mL (ref 0.3–4.0)
Relative Index: INVALID (ref 0.0–2.5)
Total CK: 46 U/L (ref 7–177)

## 2010-07-26 LAB — POCT I-STAT, CHEM 8
BUN: 11 mg/dL (ref 6–23)
Calcium, Ion: 1.13 mmol/L (ref 1.12–1.32)
Glucose, Bld: 121 mg/dL — ABNORMAL HIGH (ref 70–99)
Hemoglobin: 12.6 g/dL (ref 12.0–15.0)

## 2010-07-27 ENCOUNTER — Encounter (HOSPITAL_COMMUNITY): Payer: Self-pay | Admitting: Radiology

## 2010-07-27 LAB — CK TOTAL AND CKMB (NOT AT ARMC)
CK, MB: 1.4 ng/mL (ref 0.3–4.0)
Total CK: 31 U/L (ref 7–177)

## 2010-07-27 LAB — TROPONIN I: Troponin I: <0.3 ng/mL (ref <0.30)

## 2011-07-11 ENCOUNTER — Other Ambulatory Visit: Payer: Self-pay | Admitting: Family Medicine

## 2013-12-10 ENCOUNTER — Encounter (HOSPITAL_COMMUNITY): Payer: Self-pay | Admitting: Emergency Medicine

## 2013-12-10 ENCOUNTER — Emergency Department (HOSPITAL_COMMUNITY)
Admission: EM | Admit: 2013-12-10 | Discharge: 2013-12-10 | Discharge status: Against Medical Advice | Payer: No Typology Code available for payment source | Attending: Emergency Medicine | Admitting: Emergency Medicine

## 2013-12-10 DIAGNOSIS — G43909 Migraine, unspecified, not intractable, without status migrainosus: Secondary | ICD-10-CM | POA: Diagnosis present

## 2013-12-10 DIAGNOSIS — I10 Essential (primary) hypertension: Secondary | ICD-10-CM | POA: Insufficient documentation

## 2013-12-10 DIAGNOSIS — R112 Nausea with vomiting, unspecified: Secondary | ICD-10-CM | POA: Insufficient documentation

## 2013-12-10 HISTORY — DX: Migraine, unspecified, not intractable, without status migrainosus: G43.909

## 2013-12-10 NOTE — ED Notes (Signed)
Pt reports headache, N, V, light sensitivity. Hx of Migraine and HTN. Pt denies vision changes. No neuro deficit. Pt alert and oriented.

## 2014-07-07 ENCOUNTER — Other Ambulatory Visit: Payer: Self-pay

## 2014-08-25 ENCOUNTER — Encounter: Payer: Self-pay | Admitting: Obstetrics and Gynecology

## 2014-11-29 ENCOUNTER — Ambulatory Visit (INDEPENDENT_AMBULATORY_CARE_PROVIDER_SITE_OTHER): Payer: 59 | Admitting: Family Medicine

## 2014-11-29 ENCOUNTER — Other Ambulatory Visit: Payer: Self-pay | Admitting: Urgent Care

## 2014-11-29 ENCOUNTER — Ambulatory Visit (INDEPENDENT_AMBULATORY_CARE_PROVIDER_SITE_OTHER): Payer: 59

## 2014-11-29 VITALS — BP 142/98 | HR 77 | Temp 98.4°F | Resp 16 | Ht 61.5 in | Wt 177.0 lb

## 2014-11-29 DIAGNOSIS — Z8759 Personal history of other complications of pregnancy, childbirth and the puerperium: Secondary | ICD-10-CM

## 2014-11-29 DIAGNOSIS — I10 Essential (primary) hypertension: Secondary | ICD-10-CM | POA: Insufficient documentation

## 2014-11-29 DIAGNOSIS — E669 Obesity, unspecified: Secondary | ICD-10-CM

## 2014-11-29 DIAGNOSIS — Z8249 Family history of ischemic heart disease and other diseases of the circulatory system: Secondary | ICD-10-CM

## 2014-11-29 DIAGNOSIS — Z8639 Personal history of other endocrine, nutritional and metabolic disease: Secondary | ICD-10-CM

## 2014-11-29 LAB — COMPLETE METABOLIC PANEL WITH GFR
ALBUMIN: 4.2 g/dL (ref 3.6–5.1)
ALK PHOS: 85 U/L (ref 33–115)
ALT: 19 U/L (ref 6–29)
AST: 16 U/L (ref 10–35)
BILIRUBIN TOTAL: 0.3 mg/dL (ref 0.2–1.2)
BUN: 14 mg/dL (ref 7–25)
CO2: 27 mmol/L (ref 20–31)
CREATININE: 0.67 mg/dL (ref 0.50–1.10)
Calcium: 9.3 mg/dL (ref 8.6–10.2)
Chloride: 102 mmol/L (ref 98–110)
GFR, Est African American: >89 mL/min (ref >=60)
GLUCOSE: 96 mg/dL (ref 65–99)
Potassium: 3.8 mmol/L (ref 3.5–5.3)
SODIUM: 140 mmol/L (ref 135–146)
TOTAL PROTEIN: 7.1 g/dL (ref 6.1–8.1)

## 2014-11-29 LAB — LIPID PANEL
Cholesterol: 199 mg/dL (ref 125–200)
HDL: 42 mg/dL — AB (ref >=46)
LDL CALC: 120 mg/dL (ref <130)
Total CHOL/HDL Ratio: 4.7 Ratio (ref <=5.0)
Triglycerides: 184 mg/dL — ABNORMAL HIGH (ref <150)
VLDL: 37 mg/dL — ABNORMAL HIGH (ref <30)

## 2014-11-29 LAB — POCT URINALYSIS DIP (MANUAL ENTRY)
Bilirubin, UA: NEGATIVE
GLUCOSE UA: NEGATIVE
Ketones, POC UA: NEGATIVE
LEUKOCYTES UA: NEGATIVE
Nitrite, UA: NEGATIVE
Protein Ur, POC: NEGATIVE
SPEC GRAV UA: 1.02
UROBILINOGEN UA: 0.2
pH, UA: 6.5

## 2014-11-29 MED ORDER — LISINOPRIL-HYDROCHLOROTHIAZIDE 10-12.5 MG PO TABS: 1.0000 | ORAL_TABLET | Freq: Every day | ORAL | Status: DC

## 2014-11-29 NOTE — Patient Instructions (Signed)
Managing Your High Blood Pressure °Blood pressure is a measurement of how forceful your blood is pressing against the walls of the arteries. Arteries are muscular tubes within the circulatory system. Blood pressure does not stay the same. Blood pressure rises when you are active, excited, or nervous; and it lowers during sleep and relaxation. If the numbers measuring your blood pressure stay above normal most of the time, you are at risk for health problems. High blood pressure (hypertension) is a long-term (chronic) condition in which blood pressure is elevated. °A blood pressure reading is recorded as two numbers, such as 120 over 80 (or 120/80). The first, higher number is called the systolic pressure. It is a measure of the pressure in your arteries as the heart beats. The second, lower number is called the diastolic pressure. It is a measure of the pressure in your arteries as the heart relaxes between beats.  °Keeping your blood pressure in a normal range is important to your overall health and prevention of health problems, such as heart disease and stroke. When your blood pressure is uncontrolled, your heart has to work harder than normal. High blood pressure is a very common condition in adults because blood pressure tends to rise with age. Men and women are equally likely to have hypertension but at different times in life. Before age 45, men are more likely to have hypertension. After 48 years of age, women are more likely to have it. Hypertension is especially common in African Americans. This condition often has no signs or symptoms. The cause of the condition is usually not known. Your caregiver can help you come up with a plan to keep your blood pressure in a normal, healthy range. °BLOOD PRESSURE STAGES °Blood pressure is classified into four stages: normal, prehypertension, stage 1, and stage 2. Your blood pressure reading will be used to determine what type of treatment, if any, is necessary.  Appropriate treatment options are tied to these four stages:  °Normal °· Systolic pressure (mm Hg): below 120. °· Diastolic pressure (mm Hg): below 80. °Prehypertension °· Systolic pressure (mm Hg): 120 to 139. °· Diastolic pressure (mm Hg): 80 to 89. °Stage 1 °· Systolic pressure (mm Hg): 140 to 159. °· Diastolic pressure (mm Hg): 90 to 99. °Stage 2 °· Systolic pressure (mm Hg): 160 or above. °· Diastolic pressure (mm Hg): 100 or above. °RISKS RELATED TO HIGH BLOOD PRESSURE °Managing your blood pressure is an important responsibility. Uncontrolled high blood pressure can lead to: °· A heart attack. °· A stroke. °· A weakened blood vessel (aneurysm). °· Heart failure. °· Kidney damage. °· Eye damage. °· Metabolic syndrome. °· Memory and concentration problems. °HOW TO MANAGE YOUR BLOOD PRESSURE °Blood pressure can be managed effectively with lifestyle changes and medicines (if needed). Your caregiver will help you come up with a plan to bring your blood pressure within a normal range. Your plan should include the following: °Education °· Read all information provided by your caregivers about how to control blood pressure. °· Educate yourself on the latest guidelines and treatment recommendations. New research is always being done to further define the risks and treatments for high blood pressure. °Lifestyle changes °· Control your weight. °· Avoid smoking. °· Stay physically active. °· Reduce the amount of salt in your diet. °· Reduce stress. °· Control any chronic conditions, such as high cholesterol or diabetes. °· Reduce your alcohol intake. °Medicines °· Several medicines (antihypertensive medicines) are available, if needed, to bring blood pressure within a normal range. °  Communication °· Review all the medicines you take with your caregiver because there may be side effects or interactions. °· Talk with your caregiver about your diet, exercise habits, and other lifestyle factors that may be contributing to  high blood pressure. °· See your caregiver regularly. Your caregiver can help you create and adjust your plan for managing high blood pressure. °RECOMMENDATIONS FOR TREATMENT AND FOLLOW-UP  °The following recommendations are based on current guidelines for managing high blood pressure in nonpregnant adults. Use these recommendations to identify the proper follow-up period or treatment option based on your blood pressure reading. You can discuss these options with your caregiver. °· Systolic pressure of 120 to 139 or diastolic pressure of 80 to 89: Follow up with your caregiver as directed. °· Systolic pressure of 140 to 160 or diastolic pressure of 90 to 100: Follow up with your caregiver within 2 months. °· Systolic pressure above 160 or diastolic pressure above 100: Follow up with your caregiver within 1 month. °· Systolic pressure above 180 or diastolic pressure above 110: Consider antihypertensive therapy; follow up with your caregiver within 1 week. °· Systolic pressure above 200 or diastolic pressure above 120: Begin antihypertensive therapy; follow up with your caregiver within 1 week. °  °This information is not intended to replace advice given to you by your health care provider. Make sure you discuss any questions you have with your health care provider. °  °Document Released: 10/03/2011 Document Reviewed: 10/03/2011 °Elsevier Interactive Patient Education ©2016 [employer] ° °

## 2014-11-29 NOTE — Progress Notes (Signed)
MRN: [id number] DOB: Aug 30, 1965  Subjective:   [name] [name] W [name] [name] is a 49 y.o. female with pmh of pre-eclampsia, hyperlipidemia presenting for chief complaint of Elevated Blood Pressure and Leg Swelling  Reports 1 month history of elevated blood pressure readings at home. Patient first noticed this after she started walking for exercise. She consistently experience some chest pressure, fatigue after walking short distances of about a half mile which previously did not bother her, resolved with resting/stopping walking. She then bought a BP cuff and started measuring her BP which has been consistently in 150's systolic. In the last couple of days she has also noticed lower leg swelling. Of note, patient has had preeclampsia, during her second pregnancy she had to deliver 3 months early due to significant hypertension. Patient used to take lis-HCT, atorvastatin. She came off of this because her BP normalized. Today, she denies Denies lightheadedness, dizziness, chronic headache, double vision, chest pain, shortness of breath, heart racing, palpitations, nausea, vomiting, abdominal pain, hematuria. Her family history is positive for heart disease, heart attacks with both her parents in their 2950s. She denies smoking cigarettes or drinking alcohol. She mainly cooks for herself at home and eats and mix of vegetables, fruits and meat, minimizes carbs. Denies any other aggravating or relieving factors, no other questions or concerns.  [name] [name] currently has no medications in their medication list. Also has No Known Allergies.  [name] [name]  has a past medical history of Hypertension and Migraine. Also  has past surgical history that includes Appendectomy; Cesarean section; and Hernia repair.  Objective:   Vitals: BP 142/98 mmHg  Pulse 77  Temp(Src) 98.4 F (36.9 C) (Oral)  Resp 16  Ht 5' 1.5" (1.562 m)  Wt 177 lb (80.287 kg)  BMI 32.91 kg/m2  SpO2 98%  Physical Exam  Constitutional: She is oriented to person,  place, and time. She appears well-developed and well-nourished.  HENT:  Mouth/Throat: Oropharynx is clear and moist.  Eyes: No scleral icterus.  Neck: Normal range of motion. Neck supple. No thyromegaly present.  Cardiovascular: Normal rate, regular rhythm and intact distal pulses.  Exam reveals no gallop and no friction rub.   No murmur heard. Pulmonary/Chest: No respiratory distress. She has no wheezes. She has no rales.  Abdominal: Soft. Bowel sounds are normal. She exhibits no distension and no mass. There is no tenderness.  Musculoskeletal: She exhibits edema (1+ pitting edema bilaterally up to mid calf). She exhibits no tenderness.  Neurological: She is alert and oriented to person, place, and time.  Skin: Skin is warm and dry. No rash noted. No erythema. No pallor.  Psychiatric: She has a normal mood and affect.   Results for orders placed or performed in visit on 11/29/14 (from the past 24 hour(s))  POCT urinalysis dipstick     Status: Abnormal   Collection Time: 11/29/14  9:51 AM  Result Value Ref Range   Color, UA yellow yellow   Clarity, UA clear clear   Glucose, UA negative negative   Bilirubin, UA negative negative   Ketones, POC UA negative negative   Spec Grav, UA 1.020    Blood, UA trace-lysed (A) negative   pH, UA 6.5    Protein Ur, POC negative negative   Urobilinogen, UA 0.2    Nitrite, UA Negative Negative   Leukocytes, UA Negative Negative   ECG interpretation by Dr. Patsy Lageropland and PA-Skyelar Halliday - slightly prolonged QT interval, compared to previous ECG from 2012.  [facility] reading (PRIMARY) by  Dr.  Copland and PA-Kerri-Anne Haeberle. Chest - normal.  Assessment and Plan :   1. Essential hypertension 2. History of pre-eclampsia 3. Family history of heart disease in female family member before age 44 - Restart lis-HCT at 12.5-10mg , refer to cards for stress test. Physical exam findings reassuring today, will recheck ECG at follow up in 4-8 weeks. - Ambulatory referral to  Cardiology  4. History of hyperlipidemia - Lipid panel pending, restart statin as appropriate  5. Obesity - Recommended patient continue efforts at weight loss through exercise after we can have her evaluated by cardiologist. In the meantime continue healthy diet.  Wallis Bamberg, PA-C [facility] [phone] 11/29/2014 9:30 AM

## 2014-11-30 ENCOUNTER — Telehealth: Payer: Self-pay | Admitting: Urgent Care

## 2014-11-30 DIAGNOSIS — E782 Mixed hyperlipidemia: Secondary | ICD-10-CM

## 2014-11-30 MED ORDER — FENOFIBRATE 54 MG PO TABS: 54.0000 mg | ORAL_TABLET | Freq: Every day | ORAL | Status: DC

## 2014-11-30 NOTE — Telephone Encounter (Signed)
Reported mixed hyperlipidemia, Cmet is normal. Patient reports that she is feeling better, her BP is <140 systolic today. Recheck in 6 weeks.

## 2014-12-01 ENCOUNTER — Telehealth: Payer: Self-pay | Admitting: Family Medicine

## 2014-12-01 NOTE — Telephone Encounter (Signed)
Called and LMOM- recommend a baby asa until she sees cards Let us know if any concerns or if she does not get appt soon

## 2014-12-02 ENCOUNTER — Encounter (HOSPITAL_COMMUNITY): Payer: Self-pay | Admitting: Emergency Medicine

## 2014-12-02 ENCOUNTER — Ambulatory Visit (INDEPENDENT_AMBULATORY_CARE_PROVIDER_SITE_OTHER): Payer: 59 | Admitting: Urgent Care

## 2014-12-02 ENCOUNTER — Emergency Department (HOSPITAL_COMMUNITY): Payer: 59

## 2014-12-02 ENCOUNTER — Emergency Department (HOSPITAL_COMMUNITY)
Admission: EM | Admit: 2014-12-02 | Discharge: 2014-12-02 | Disposition: A | Discharge status: Home / Self Care | Payer: 59 | Attending: Emergency Medicine | Admitting: Emergency Medicine

## 2014-12-02 VITALS — BP 138/90 | HR 128 | Temp 98.5°F | Resp 18 | Ht 61.0 in | Wt 175.4 lb

## 2014-12-02 DIAGNOSIS — I10 Essential (primary) hypertension: Secondary | ICD-10-CM | POA: Diagnosis not present

## 2014-12-02 DIAGNOSIS — Z7982 Long term (current) use of aspirin: Secondary | ICD-10-CM | POA: Insufficient documentation

## 2014-12-02 DIAGNOSIS — R002 Palpitations: Secondary | ICD-10-CM

## 2014-12-02 DIAGNOSIS — R609 Edema, unspecified: Secondary | ICD-10-CM

## 2014-12-02 DIAGNOSIS — R5383 Other fatigue: Secondary | ICD-10-CM | POA: Diagnosis not present

## 2014-12-02 DIAGNOSIS — R000 Tachycardia, unspecified: Secondary | ICD-10-CM | POA: Insufficient documentation

## 2014-12-02 DIAGNOSIS — I4892 Unspecified atrial flutter: Secondary | ICD-10-CM

## 2014-12-02 DIAGNOSIS — R200 Anesthesia of skin: Secondary | ICD-10-CM

## 2014-12-02 DIAGNOSIS — R202 Paresthesia of skin: Secondary | ICD-10-CM | POA: Diagnosis not present

## 2014-12-02 DIAGNOSIS — Z79899 Other long term (current) drug therapy: Secondary | ICD-10-CM | POA: Insufficient documentation

## 2014-12-02 LAB — BASIC METABOLIC PANEL
Anion gap: 10 (ref 5–15)
BUN: 23 mg/dL (ref 7–25)
BUN: 22 mg/dL — AB (ref 6–20)
CALCIUM: 9.6 mg/dL (ref 8.9–10.3)
CHLORIDE: 98 mmol/L (ref 98–110)
CO2: 24 mmol/L (ref 20–31)
CO2: 25 mmol/L (ref 22–32)
CREATININE: 0.75 mg/dL (ref 0.44–1.00)
Calcium: 10.2 mg/dL (ref 8.6–10.2)
Chloride: 102 mmol/L (ref 101–111)
Creat: 0.72 mg/dL (ref 0.50–1.10)
GFR calc Af Amer: >60 mL/min (ref >60)
GLUCOSE: 102 mg/dL — AB (ref 65–99)
Glucose, Bld: 150 mg/dL — ABNORMAL HIGH (ref 65–99)
POTASSIUM: 3.6 mmol/L (ref 3.5–5.3)
Potassium: 3.4 mmol/L — ABNORMAL LOW (ref 3.5–5.1)
Sodium: 137 mmol/L (ref 135–146)
Sodium: 137 mmol/L (ref 135–145)

## 2014-12-02 LAB — POCT CBC
Granulocyte percent: 80.8 %G — AB (ref 37–80)
HEMATOCRIT: 35.7 % — AB (ref 37.7–47.9)
Hemoglobin: 11.9 g/dL — AB (ref 12.2–16.2)
Lymph, poc: 1.5 (ref 0.6–3.4)
MCH, POC: 26.5 pg — AB (ref 27–31.2)
MCHC: 33.5 g/dL (ref 31.8–35.4)
MCV: 79.2 fL — AB (ref 80–97)
MID (cbc): 0.5 (ref 0–0.9)
MPV: 6 fL (ref 0–99.8)
POC GRANULOCYTE: 8.3 — AB (ref 2–6.9)
POC LYMPH %: 14.3 % (ref 10–50)
POC MID %: 4.9 %M (ref 0–12)
Platelet Count, POC: 446 10*3/uL — AB (ref 142–424)
RBC: 4.5 M/uL (ref 4.04–5.48)
RDW, POC: 14.7 %
WBC: 10.3 10*3/uL — AB (ref 4.6–10.2)

## 2014-12-02 LAB — CBC
HEMATOCRIT: 36.5 % (ref 36.0–46.0)
Hemoglobin: 11.4 g/dL — ABNORMAL LOW (ref 12.0–15.0)
MCH: 26 pg (ref 26.0–34.0)
MCHC: 31.2 g/dL (ref 30.0–36.0)
MCV: 83.1 fL (ref 78.0–100.0)
PLATELETS: 416 10*3/uL — AB (ref 150–400)
RBC: 4.39 MIL/uL (ref 3.87–5.11)
RDW: 13.7 % (ref 11.5–15.5)
WBC: 9.1 10*3/uL (ref 4.0–10.5)

## 2014-12-02 LAB — POCT URINALYSIS DIP (MANUAL ENTRY)
BILIRUBIN UA: NEGATIVE
BILIRUBIN UA: NEGATIVE
GLUCOSE UA: NEGATIVE
Leukocytes, UA: NEGATIVE
Nitrite, UA: NEGATIVE
Protein Ur, POC: NEGATIVE
SPEC GRAV UA: 1.02
UROBILINOGEN UA: 0.2
pH, UA: 5.5

## 2014-12-02 LAB — BRAIN NATRIURETIC PEPTIDE: BRAIN NATRIURETIC PEPTIDE: 2 pg/mL (ref 0.0–100.0)

## 2014-12-02 LAB — I-STAT TROPONIN, ED: TROPONIN I, POC: 0 ng/mL (ref 0.00–0.08)

## 2014-12-02 NOTE — Patient Instructions (Signed)
Atrial Flutter °Atrial flutter is a heart rhythm that can cause the heart to beat very fast (tachycardia). It originates in the upper chambers of the heart (atria). In atrial flutter, the top chambers of the heart (atria) often beat much faster than the bottom chambers of the heart (ventricles). Atrial flutter has a regular "saw toothed" appearance in an EKG readout. An EKG is a test that records the electrical activity of the heart. Atrial flutter can cause the heart to beat up to 150 beats per minute (BPM). Atrial flutter can either be short lived (paroxysmal) or permanent.  °CAUSES  °Causes of atrial flutter can be many. Some of these include: °· Heart related issues: °¨ Heart attack (myocardial infarction). °¨ Heart failure. °¨ Heart valve problems. °¨ Poorly controlled high blood pressure (hypertension). °¨ After open heart surgery. °· Lung related issues: °¨ A blood clot in the lungs (pulmonary embolism). °¨ Chronic obstructive pulmonary disease (COPD). Medications used to treat COPD can attribute to atrial flutter. °· Other related causes: °¨ Hyperthyroidism. °¨ Caffeine. °¨ Some decongestant cold medications. °¨ Low electrolyte levels such as potassium or magnesium. °¨ Cocaine. °SYMPTOMS °· An awareness of your heart beating rapidly (palpitations). °· Shortness of breath. °· Chest pain. °· Low blood pressure (hypotension). °· Dizziness or fainting. °DIAGNOSIS  °Different tests can be performed to diagnose atrial flutter.  °· An EKG. °· Holter monitor. This is a 24-hour recording of your heart rhythm. You will also be given a diary. Write down all symptoms that you have and what you were doing at the time you experienced symptoms. °· Cardiac event monitor. This small device can be worn for up to 30 days. When you have heart symptoms, you will push a button on the device. This will then record your heart rhythm. °· Echocardiogram. This is an imaging test to look at your heart. Your caregiver will look at your  heart valves and the ventricles. °· Stress test. This test can help determine if the atrial flutter is related to exercise or if coronary artery disease is present. °· Laboratory studies will look at certain blood levels like: °¨ Complete blood count (CBC). °¨ Potassium. °¨ Magnesium. °¨ Thyroid function. °TREATMENT  °Treatment of atrial flutter varies. A combination of therapies may be used or sometimes atrial flutter may need only 1 type of treatment.  °Lab work: °If your blood work, such as your electrolytes (potassium, magnesium) or your thyroid function tests, are abnormal, your caregiver will treat them accordingly.  °Medication:  °There are several different types of medications that can convert your heart to a normal rhythm and prevent atrial flutter from reoccurring.  °Nonsurgical procedures: °Nonsurgical techniques may be used to control atrial flutter. Some examples include: °· Cardioversion. This technique uses either drugs or an electrical shock to restore a normal heart rhythm: °¨ Cardioversion drugs may be given through an intravenous (IV) line to help "reset" the heart rhythm. °¨ In electrical cardioversion, your caregiver shocks your heart with electrical energy. This helps to reset the heartbeat to a normal rhythm. °· Ablation. If atrial flutter is a persistent problem, an ablation may be needed. This procedure is done under mild sedation. High frequency radio-wave energy is used to destroy the area of heart tissue responsible for atrial flutter. °SEEK IMMEDIATE MEDICAL CARE IF:  °You have: °· Dizziness. °· Near fainting or fainting. °· Shortness of breath. °· Chest pain or pressure. °· Sudden nausea or vomiting. °· Profuse sweating. °If you have the above symptoms,   call your local emergency service immediately! Do not drive yourself to the hospital. °MAKE SURE YOU:  °· Understand these instructions. °· Will watch your condition. °· Will get help right away if you are not doing well or get worse. °   °This information is not intended to replace advice given to you by your health care provider. Make sure you discuss any questions you have with your health care provider. °  °Document Released: 05/27/2008 Document Revised: 01/29/2014 Document Reviewed: 07/23/2014 °Elsevier Interactive Patient Education ©2016 Else[employer] Inc. ° °

## 2014-12-02 NOTE — Discharge Instructions (Signed)

## 2014-12-02 NOTE — ED Notes (Addendum)
EMS called from urgent care abnormal EKG patient seen 2 days ago for headache and high blood pressure. Was given lisinopril hydrchlorothiazide and since then felt heart rate increase and "flutter" feeling. Bilateral arm tingling sensation.

## 2014-12-02 NOTE — Progress Notes (Signed)
MRN: [id number] DOB: [birth year]  Subjective:   [name] [name] is a 49 y.o. female presenting for chief complaint of Numbness and increase pulse  Reports onset of bilateral arm numbness (R>L), heart racing, palpitations, fatigue intermittent shortness of breath since this morning. Still has lower leg edema. She denies chest pain but admits chest tightness and mid-sternal pressure. Of note, patient was restarted on her blood pressure medicine on 11/29/2014. She has done well since restarting lisinopril-HCT except for today. Patient has avoided doing exercise as recommended. She has not heard from [place] about her cardiology referral. Denies diaphoresis, jaw pain, neck pain, abdominal pain, n/v, hematuria, headache, double or blurred vision, weakness, slurred speech, confusion or disorientation. She denies smoking or drinking alcohol. Has a strong family history of heart disease including MI in both her parents in their 43s. Denies any other aggravating or relieving factors, no other questions or concerns.  [name] [name] has a current medication list which includes the following prescription(s): fenofibrate and lisinopril-hydrochlorothiazide. Also has No Known Allergies.  [name] [name]  has a past medical history of Hypertension and Migraine. Also  has past surgical history that includes Appendectomy; Cesarean section; and Hernia repair.  Objective:   Vitals: BP 138/90 mmHg  Pulse 128  Temp(Src) 98.5 F (36.9 C) (Oral)  Resp 18  Ht  (1.549 m)  Wt 175 lb 6.4 oz (79.561 kg)  BMI 33.16 kg/m2  SpO2 98%  Physical Exam  Constitutional: She is oriented to person, place, and time. She appears well-developed and well-nourished.  HENT:  Mouth/Throat: Oropharynx is clear and moist.  Eyes: Right eye exhibits no discharge. Left eye exhibits no discharge. No scleral icterus.  Neck: Normal range of motion. Neck supple. No thyromegaly present.  Cardiovascular: Tachycardia present.  Exam reveals no gallop and no  friction rub.   No murmur heard. Pulmonary/Chest: No respiratory distress. She has no wheezes. She has no rales.  Abdominal: Soft. Bowel sounds are normal. She exhibits no distension and no mass. There is no tenderness.  Musculoskeletal: She exhibits edema (1+ pitting edema up to midcalf bilaterally).  Lymphadenopathy:    She has no cervical adenopathy.  Neurological: She is alert and oriented to person, place, and time. She has normal reflexes. No cranial nerve deficit. Coordination normal.  Skin: Skin is warm and dry. No rash noted. No erythema. No pallor.  Psychiatric: Her mood appears anxious.   Results for orders placed or performed in visit on 12/02/14 (from the past 24 hour(s))  POCT CBC     Status: Abnormal   Collection Time: 12/02/14 11:05 AM  Result Value Ref Range   WBC 10.3 (A) 4.6 - 10.2 K/uL   Lymph, poc 1.5 0.6 - 3.4   POC LYMPH PERCENT 14.3 10 - 50 %L   MID (cbc) 0.5 0 - 0.9   POC MID % 4.9 0 - 12 %M   POC Granulocyte 8.3 (A) 2 - 6.9   Granulocyte percent 80.8 (A) 37 - 80 %G   RBC 4.50 4.04 - 5.48 M/uL   Hemoglobin 11.9 (A) 12.2 - 16.2 g/dL   HCT, POC 52.8 (A) 41.3 - 47.9 %   MCV 79.2 (A) 80 - 97 fL   MCH, POC 26.5 (A) 27 - 31.2 pg   MCHC 33.5 31.8 - 35.4 g/dL   RDW, POC 24.4 %   Platelet Count, POC 446 (A) 142 - 424 K/uL   MPV 6.0 0 - 99.8 fL  POCT urinalysis dipstick  Status: Abnormal   Collection Time: 12/02/14 11:09 AM  Result Value Ref Range   Color, UA yellow yellow   Clarity, UA clear clear   Glucose, UA negative negative   Bilirubin, UA negative negative   Ketones, POC UA negative negative   Spec Grav, UA 1.020    Blood, UA trace-lysed (A) negative   pH, UA 5.5    Protein Ur, POC negative negative   Urobilinogen, UA 0.2    Nitrite, UA Negative Negative   Leukocytes, UA Negative Negative   ECG interpretation by Dr. Milus GlazierLauenstein and PA-Kassie Keng - atrial flutter, tachycardia.  Assessment and Plan :   This case was precepted with Dr. Elvina SidleKurt  Lauenstein.   1. Atrial flutter, unspecified type (HCC) 2. Tachycardia 3. Palpitations 4. Other fatigue 5. Essential hypertension 6. Numbness and tingling 7. Pitting edema - Will send out via EMS for evaluation/management of tachycardia and atrial flutter. Discussed differential for patient, she verbalized understanding. Continue medications for now.  Wallis BambergMario Aniza Shor, PA-C [facility] [phone] 12/02/2014 10:56 AM

## 2014-12-02 NOTE — ED Provider Notes (Signed)
CSN: [id number]     Arrival date & time 12/02/14  1216 History   First MD Initiated Contact with Patient 12/02/14 1219     Chief Complaint  Patient presents with  . Palpitations     (Consider location/radiation/quality/duration/timing/severity/associated sxs/prior Treatment) Patient is a 49 y.o. female presenting with palpitations.  Palpitations Palpitations quality:  Irregular Onset quality:  Gradual Duration:  2 days Timing:  Constant Progression:  Unchanged Chronicity:  Recurrent Context: not anxiety   Relieved by:  Nothing Worsened by:  Nothing Associated symptoms: lower extremity edema (resolving after HCTZ)   Associated symptoms: no chest pain, no chest pressure, no cough, no nausea, no numbness, no orthopnea, no shortness of breath and no vomiting     Past Medical History  Diagnosis Date  . Hypertension   . Migraine    Past Surgical History  Procedure Laterality Date  . Appendectomy    . Cesarean section    . Hernia repair     Family History  Problem Relation Age of Onset  . Heart disease Mother   . Hypertension Mother   . Diabetes Father   . Heart disease Father   . Hypertension Father   . Heart disease Maternal Grandmother   . Hypertension Maternal Grandmother   . Heart disease Maternal Grandfather   . Hypertension Maternal Grandfather   . Stroke Maternal Grandfather   . Stroke Paternal Grandfather    Social History  Substance Use Topics  . Smoking status: Never Smoker   . Smokeless tobacco: None  . Alcohol Use: No   OB History    No data available     Review of Systems  Respiratory: Negative for cough and shortness of breath.   Cardiovascular: Positive for palpitations. Negative for chest pain and orthopnea.  Gastrointestinal: Negative for nausea and vomiting.  Neurological: Negative for numbness.  All other systems reviewed and are negative.     Allergies  Review of patient's allergies indicates no known allergies.  Home Medications    Prior to Admission medications   Medication Sig Start Date End Date Taking? Authorizing Provider  aspirin EC 81 MG tablet Take 81 mg by mouth daily.   Yes Historical Provider, MD  lisinopril-hydrochlorothiazide (PRINZIDE,ZESTORETIC) 10-12.5 MG tablet Take 1 tablet by mouth daily. 11/29/14  Yes Wallis Bamberg, PA-C  fenofibrate 54 MG tablet Take 1 tablet (54 mg total) by mouth daily. Patient not taking: Reported on 12/02/2014 11/30/14   Wallis Bamberg, PA-C   BP 115/78 mmHg  Pulse 100  Temp(Src) 98.3 F (36.8 C) (Oral)  Resp 18  Ht  (1.549 m)  Wt 170 lb (77.111 kg)  BMI 32.14 kg/m2  SpO2 96% Physical Exam  Constitutional: She is oriented to person, place, and time. She appears well-developed and well-nourished.  HENT:  Head: Normocephalic and atraumatic.  Right Ear: External ear normal.  Left Ear: External ear normal.  Eyes: Conjunctivae and EOM are normal. Pupils are equal, round, and reactive to light.  Neck: Normal range of motion. Neck supple.  Cardiovascular: Normal rate, regular rhythm, normal heart sounds and intact distal pulses.   Pulmonary/Chest: Effort normal and breath sounds normal.  Abdominal: Soft. Bowel sounds are normal. There is no tenderness.  Musculoskeletal: Normal range of motion.  Neurological: She is alert and oriented to person, place, and time. She has normal strength and normal reflexes. No cranial nerve deficit or sensory deficit. Coordination normal. GCS eye subscore is 4. GCS verbal subscore is 5. GCS motor subscore is 6.  Skin: Skin is warm and dry.  Vitals reviewed.   ED Course  Procedures (including critical care time) Labs Review Labs Reviewed  BASIC METABOLIC PANEL - Abnormal; Notable for the following:    Potassium 3.4 (*)    Glucose, Bld 102 (*)    BUN 22 (*)    All other components within normal limits  CBC - Abnormal; Notable for the following:    Hemoglobin 11.4 (*)    Platelets 416 (*)    All other components within normal limits   I-STAT TROPOININ, ED    Imaging Review Dg Chest 2 View  12/02/2014  CLINICAL DATA:  Bilateral sternal pressure. Discomfort with breathing out. Chest pain beginning today. EXAM: CHEST - 2 VIEW COMPARISON:  Two-view chest x-ray 11/29/2014. FINDINGS: The heart size is normal. The lungs are clear. The visualized soft tissues and bony thorax are unremarkable. IMPRESSION: No active disease. Electronically Signed   By: Marin Robertshristopher  Mattern M.D.   On: 12/02/2014 13:33   I have personally reviewed and evaluated these images and lab results as part of my medical decision-making.   EKG Interpretation   Date/Time:  Thursday December 02 2014 12:22:20 EST Ventricular Rate:  114 PR Interval:  116 QRS Duration: 72 QT Interval:  347 QTC Calculation: 478 R Axis:   18 Text Interpretation:  Ectopic atrial tachycardia, unifocal Low voltage,  precordial leads Minimal ST elevation, inferior leads No significant  change since last tracing Although rate has increased Confirmed by Mirian MoGentry,  Dajiah Kooi [phone]([id number]) on 12/02/2014 12:42:57 PM      MDM   Final diagnoses:  Palpitations    49 y.o. female with pertinent PMH of HTN recently started in lisinopril/HCTZ presents with 1 day palpitations and tingling of hands.  No lower extremity symptoms.  Physical exam today benign, no focal neuro deficits, no appreciable leg swelling.  No dyspnea or other findings to indicate PE.   ECGs with intermittent sinus tachycardia, including here, and rate has spontaneously improved.  No new symptoms in 6 hours.  Trop negative.  DC home to fu with cardiology  I have reviewed all laboratory and imaging studies if ordered as above  1. Palpitations         Mirian MoMatthew Minetta Krisher, MD 12/02/14 [phone]

## 2014-12-05 LAB — HEMOGLOBIN A1C
HEMOGLOBIN A1C: 5.4 % (ref <5.7)
MEAN PLASMA GLUCOSE: 108 mg/dL (ref <117)

## 2014-12-24 ENCOUNTER — Ambulatory Visit: Payer: No Typology Code available for payment source | Admitting: Internal Medicine

## 2014-12-24 DIAGNOSIS — R0989 Other specified symptoms and signs involving the circulatory and respiratory systems: Secondary | ICD-10-CM

## 2014-12-27 ENCOUNTER — Encounter: Payer: Self-pay | Admitting: Internal Medicine

## 2015-01-23 DIAGNOSIS — E781 Pure hyperglyceridemia: Secondary | ICD-10-CM | POA: Insufficient documentation

## 2015-01-23 DIAGNOSIS — R7303 Prediabetes: Secondary | ICD-10-CM

## 2015-01-23 HISTORY — DX: Prediabetes: R73.03

## 2015-01-23 HISTORY — DX: Pure hyperglyceridemia: E78.1

## 2016-01-23 DIAGNOSIS — F32A Depression, unspecified: Secondary | ICD-10-CM | POA: Insufficient documentation

## 2016-01-23 DIAGNOSIS — F329 Major depressive disorder, single episode, unspecified: Secondary | ICD-10-CM | POA: Insufficient documentation

## 2016-01-23 HISTORY — DX: Depression, unspecified: F32.A

## 2016-04-15 IMAGING — DX DG CHEST 2V
2 series · 2 of 2 positions shown · non-contrast
Comparison: Two-view chest x-ray 11/29/2014.

CLINICAL DATA: Bilateral sternal pressure. Discomfort with
breathing out. Chest pain beginning today.

EXAM:
CHEST - 2 VIEW

[chest pa]
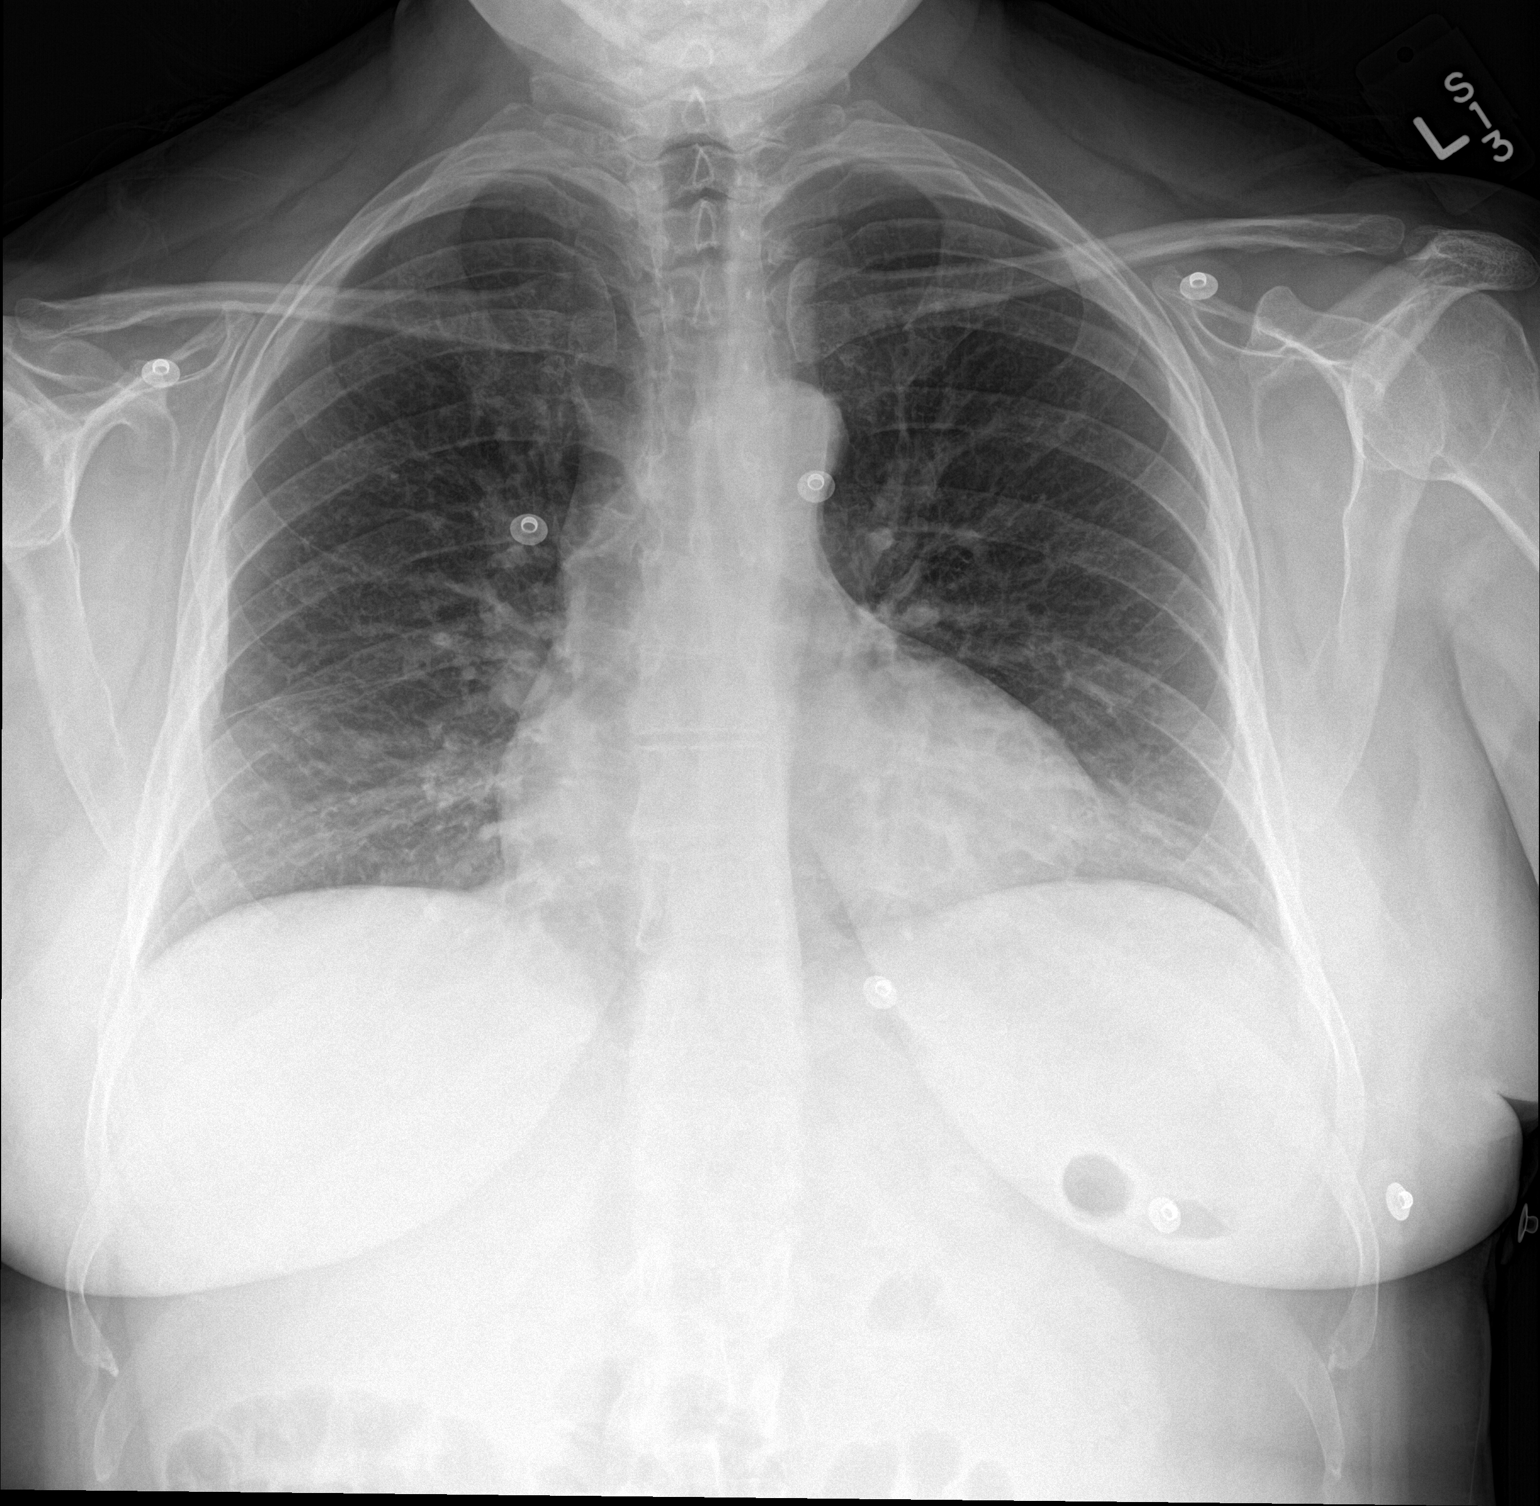

[chest lat]
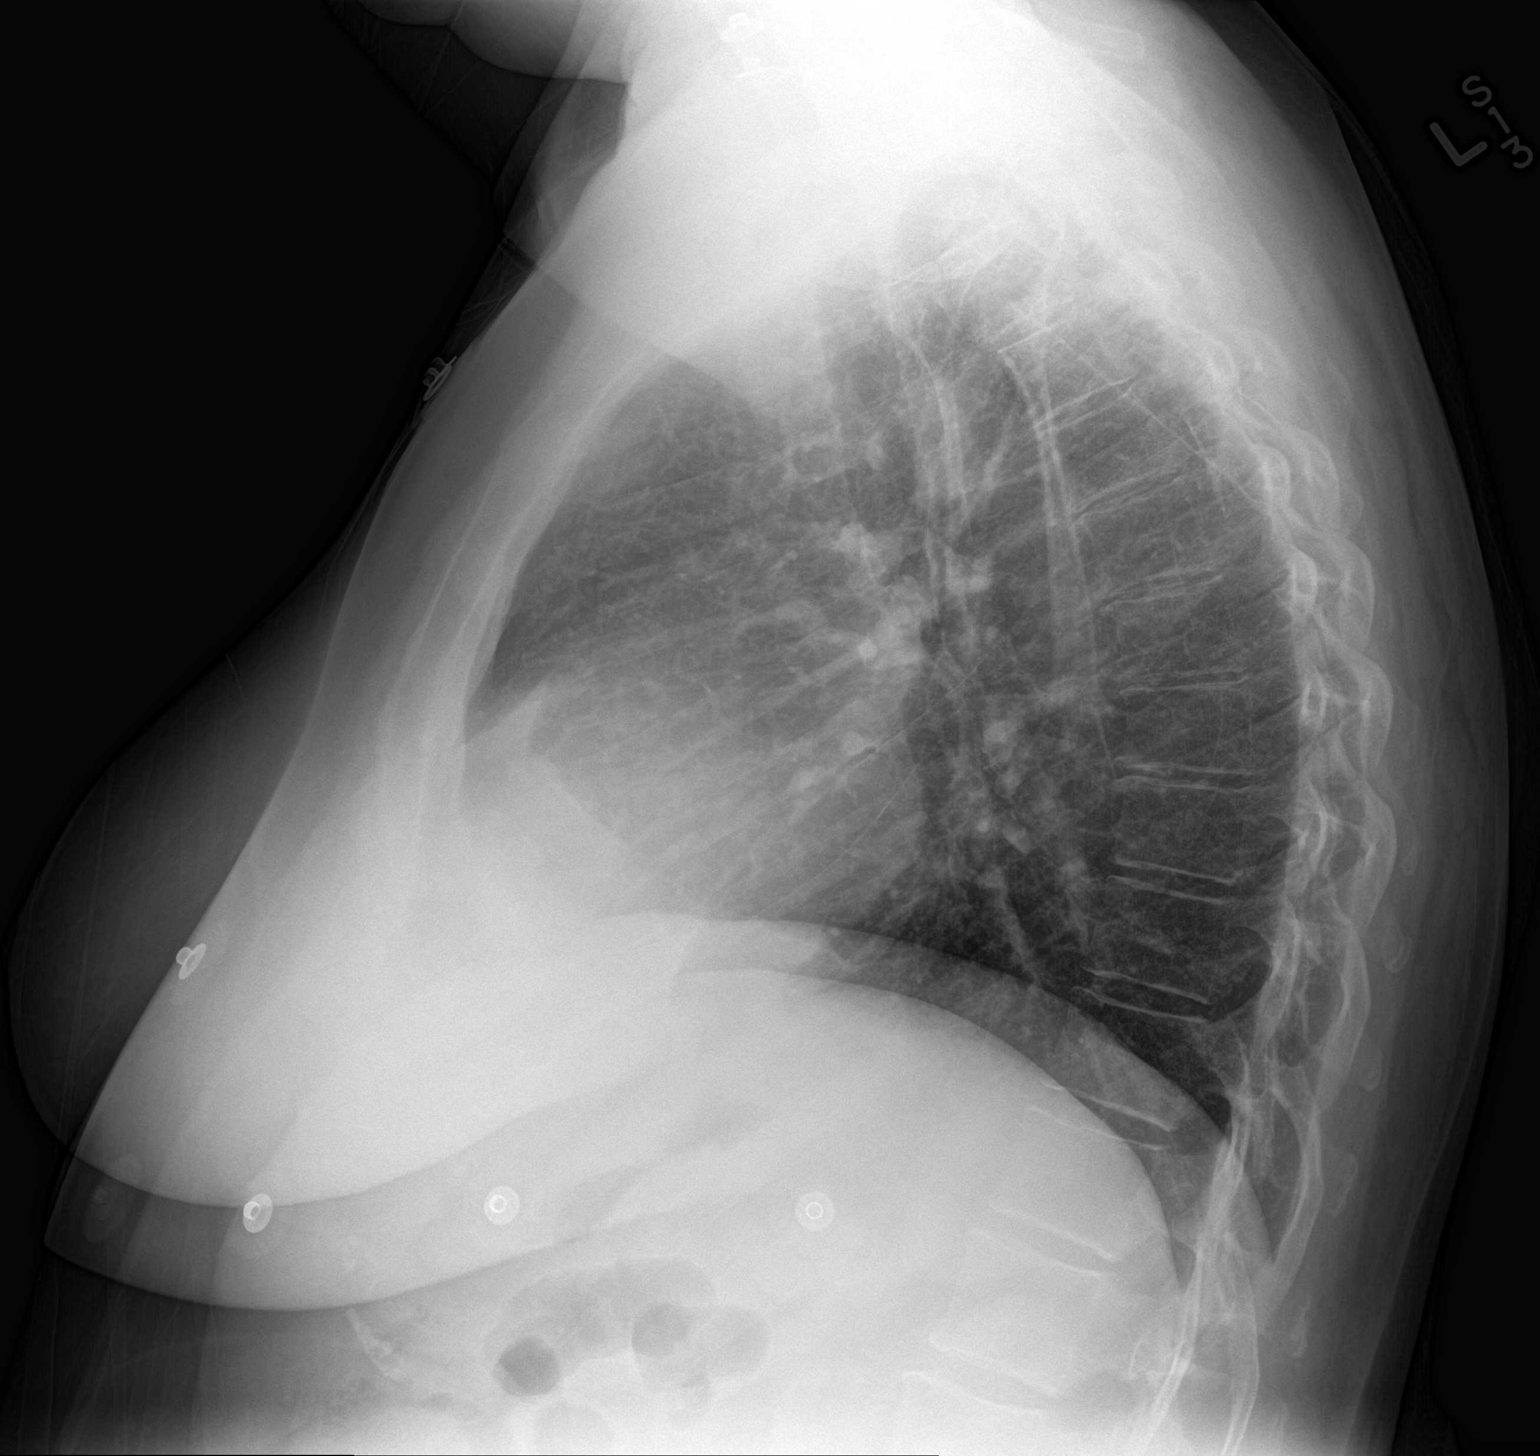

[2 of 2 positions shown; findings below may reference images not displayed]

FINDINGS: The heart size is normal. The lungs are clear. The visualized soft
tissues and bony thorax are unremarkable.
IMPRESSION: No active disease.

## 2017-07-26 ENCOUNTER — Telehealth: Payer: Self-pay | Admitting: Licensed Clinical Social Worker

## 2017-07-26 ENCOUNTER — Ambulatory Visit: Payer: Self-pay | Admitting: Internal Medicine

## 2017-07-26 ENCOUNTER — Encounter: Payer: Self-pay | Admitting: Internal Medicine

## 2017-07-26 VITALS — BP 120/74 | HR 70 | Resp 14 | Ht 61.0 in | Wt 178.0 lb

## 2017-07-26 DIAGNOSIS — I10 Essential (primary) hypertension: Secondary | ICD-10-CM

## 2017-07-26 DIAGNOSIS — R7303 Prediabetes: Secondary | ICD-10-CM

## 2017-07-26 DIAGNOSIS — F329 Major depressive disorder, single episode, unspecified: Secondary | ICD-10-CM

## 2017-07-26 DIAGNOSIS — E669 Obesity, unspecified: Secondary | ICD-10-CM

## 2017-07-26 DIAGNOSIS — Z6833 Body mass index (BMI) 33.0-33.9, adult: Secondary | ICD-10-CM

## 2017-07-26 MED ORDER — DULOXETINE HCL 30 MG PO CPEP: 30.0000 mg | ORAL_CAPSULE | Freq: Every day | ORAL | 3 refills | Status: AC

## 2017-07-26 MED ORDER — LISINOPRIL 10 MG PO TABS: 10.0000 mg | ORAL_TABLET | Freq: Every day | ORAL | 3 refills | Status: AC

## 2017-07-26 MED ORDER — HYDROCHLOROTHIAZIDE 25 MG PO TABS: 25.0000 mg | ORAL_TABLET | Freq: Every day | ORAL | 3 refills | Status: AC

## 2017-07-26 NOTE — Telephone Encounter (Signed)
LCSW called pt following a referral from Dr. Delrae AlfredMulberry for counseling; left voicemail.

## 2017-07-26 NOTE — Progress Notes (Signed)
Subjective:    Patient ID: [name] [name], female    DOB: 19-Dec-1965, 52 y.o.   MRN: [id number]  HPI   Here to establish  1.  Essential Hypertension:  Diagnosed 20 years ago.  She has one more day of her Lisinopril 10 mg and HCTZ 25 mg daily.    2.  Postmenopausal:  Underwent TAH and BSO in 2007.  Was on ERT for just a couple of months.  Went through menopause at that time.  Still with occasional hot flashes that are minor.    3.  Depression after lost mother last year.  Was placed on Duloxetine and felt great.  She took from October 2018 until mid April as she could not afford to go back to clinic. Was sleeping well.  Now she is not.  She has also developed leg aching.  Keeps her up at night.  No varicose veins in her legs she is aware of.  Overwhelmed with life at times--has 2 children on autistic spectrum, high functioning.  Lost her mother.  Father had a leg amputated due to DM and helps him.  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    4.  Prediabetes:  Looking into [facility] chart, history of this with A1C of 5.7%.  Was able to normalize with lifestyle changes in 2018  Current Meds  Medication Sig  . aspirin EC 81 MG tablet Take 81 mg by mouth daily.  . hydrochlorothiazide (HYDRODIURIL) 25 MG tablet Take 1 tablet (25 mg total) by mouth daily.  Marland Kitchen. lisinopril (PRINIVIL,ZESTRIL) 10 MG tablet Take 1 tablet (10 mg total) by mouth daily.  . [DISCONTINUED] hydrochlorothiazide (HYDRODIURIL) 25 MG tablet Take 25 mg by mouth daily.  . [DISCONTINUED] lisinopril (PRINIVIL,ZESTRIL) 10 MG tablet Take 10 mg by mouth daily.   No Known Allergies   Past Medical History:  Diagnosis Date  . Depression 2018   Reaction to loss of mother.  No history prior  . Endometriosis 1997  . Hypertension 1999  . Hypertriglyceridemia 2017  . Migraine     Past Surgical History:  Procedure Laterality Date  . APPENDECTOMY  2002  . CESAREAN SECTION  1999, 2004  . HERNIA REPAIR Bilateral 1969  . LAPAROSCOPIC  ABDOMINAL EXPLORATION  1997   For Endometriosis    Family History  Problem Relation Age of Onset  . Heart disease Mother        died from CHF  . Hypertension Mother   . Parkinson's disease Mother   . Diabetes Father   . Heart disease Father   . Hypertension Father   . Heart disease Maternal Grandmother   . Hypertension Maternal Grandmother   . Heart disease Maternal Grandfather   . Hypertension Maternal Grandfather   . Stroke Maternal Grandfather   . Stroke Paternal Grandfather   . Autism spectrum disorder Son        High functioning  . Autism spectrum disorder Son        High functioning  . Migraines Son        hemiplegia with migraines   Social History   Socioeconomic History  . Marital status: Divorced    Spouse name: Not on file  . Number of children: 2  . Years of education: 1414  . Highest education level: Associate degree: occupational, Scientist, product/process developmenttechnical, or vocational program  Occupational History  . Occupation: ArchitectChurch Secretary    Comment: [employer].  Social Needs  . Financial resource strain: Somewhat hard  .  Food insecurity:    Worry: Sometimes true    Inability: Sometimes true  . Transportation needs:    Medical: Yes    Non-medical: Yes  Tobacco Use  . Smoking status: Never Smoker  . Smokeless tobacco: Never Used  Substance and Sexual Activity  . Alcohol use: No    Alcohol/week: 0.0 oz  . Drug use: No  . Sexual activity: Not Currently    Birth control/protection: Surgical  Lifestyle  . Physical activity:    Days per week: 7 days    Minutes per session: 30 min  . Stress: To some extent  Relationships  . Social connections:    Talks on phone: More than three times a week    Gets together: More than three times a week    Attends religious service: More than 4 times per year    Active member of club or organization: No    Attends meetings of clubs or organizations: Never    Relationship status: Divorced  . Intimate partner violence:     Fear of current or ex partner: No    Emotionally abused: No    Physically abused: No    Forced sexual activity: Not on file  Other Topics Concern  . Not on file  Social History Narrative   Lives with her two sons here in [place]     Review of Systems     Objective:   Physical Exam NAD HEENT:  PERRL, EOMI, unable to see discs due to small pupils. Throat without injection.  TMs pearly gray Neck:  Supple, no adenopathy, no thyromegaly Chest:  CTA CV:  RRR with normal S1 and S2, No S3, S4 or murmur.  No carotid bruits.  Carotid, radial, femoral, DP and PT pulses normal and equal Abd:  S, NT, No HSM or mass, + BS LE: trace pitting edema to high pretibial area.  No obvious varicosities.       Assessment & Plan:  1.  Essential Hypertension:  Controlled.  Continue Lisinopril 10 and HCTZ 25 mg daily.  2.  Peripheral edema:  Continue to avoid sodium and elevate legs as much as possible.  HCTZ as above.  3.  Depression/dysthymia/stress:  Recommend continuing Duloxetine 30 mg for a year and then consider wean.   Referral to Samul Dada, LCSW for counseling.  4.  History Prediabetes:  Check fasting labs before CPE in 3 months.

## 2017-07-26 NOTE — Patient Instructions (Signed)
Can google "advance directives, [place]"  And bring up form from Secretary of State. Print and fill out Or can go to "5 wishes"  Which is also in Spanish and fill out--this costs $5--perhaps easier to use. Designate a Medical Power of Attorney to speak for you if you are unable to speak for yourself when ill or injured  

## 2017-10-25 ENCOUNTER — Other Ambulatory Visit: Payer: Self-pay

## 2017-10-29 ENCOUNTER — Encounter: Payer: Self-pay | Admitting: Internal Medicine

## 2019-04-09 ENCOUNTER — Ambulatory Visit: Payer: No Typology Code available for payment source | Attending: Internal Medicine

## 2019-04-16 ENCOUNTER — Ambulatory Visit: Payer: No Typology Code available for payment source | Attending: Family

## 2019-04-16 DIAGNOSIS — Z23 Encounter for immunization: Secondary | ICD-10-CM

## 2019-04-16 NOTE — Progress Notes (Signed)
   Covid-19 Vaccination Clinic  Name:  [name] [name]    MRN: [id number] DOB: 1965-01-26  04/16/2019  [name] was observed post Covid-19 immunization for 15 minutes without incident. She was provided with Vaccine Information Sheet and instruction to access the V-Safe system.   [name] was instructed to call 911 with any severe reactions post vaccine: Marland Kitchen Difficulty breathing  . Swelling of face and throat  . A fast heartbeat  . A bad rash all over body  . Dizziness and weakness   Immunizations Administered    Name Date Dose VIS Date Route   Moderna COVID-19 Vaccine 04/16/2019  1:35 PM 0.5 mL 12/23/2018 Intramuscular   Manufacturer: Moderna   Lot: [id number]   NDC: 52589-483-47

## 2019-05-19 ENCOUNTER — Ambulatory Visit: Payer: No Typology Code available for payment source | Attending: Family

## 2019-05-19 DIAGNOSIS — Z23 Encounter for immunization: Secondary | ICD-10-CM

## 2019-05-19 NOTE — Progress Notes (Signed)
   Covid-19 Vaccination Clinic  Name:  [name] [name]    MRN: [id number] DOB: Nov 23, 1965  05/19/2019  [name] was observed post Covid-19 immunization for 15 minutes without incident. She was provided with Vaccine Information Sheet and instruction to access the V-Safe system.   [name] was instructed to call 911 with any severe reactions post vaccine: Marland Kitchen Difficulty breathing  . Swelling of face and throat  . A fast heartbeat  . A bad rash all over body  . Dizziness and weakness   Immunizations Administered    Name Date Dose VIS Date Route   Moderna COVID-19 Vaccine 05/19/2019  1:29 PM 0.5 mL 12/2018 Intramuscular   Manufacturer: Moderna   Lot: [id number]   NDC: 41638-453-64

## 2023-05-16 ENCOUNTER — Ambulatory Visit: Payer: BC Managed Care – PPO | Admitting: Family Medicine

## 2023-05-16 NOTE — Progress Notes (Deleted)
 Office Note 05/16/2023  CC: No chief complaint on file.   HPI:  [name] [name] is a 58 y.o. female who is here to establish care*** Patient's most recent primary MD: Dr. Dawne Euler, internal medicine [place] [facility] ([facility] Old records in [facility] EMR were reviewed prior to or during today's visit.  *** Lisinopril ->hx cough  Labs at prior PCP 06/07/2022: Hemoglobin A1c 6.2%, TSH 0.82, CBC normal except platelets mildly elevated at 458K. Complete metabolic panel normal (GFR greater than 90). Total cholesterol 209, triglycerides 281, HDL 45, LDL 121.  Past Medical History:  Diagnosis Date   Depression 2018   Reaction to loss of mother.  No history prior   Endometriosis 1997   Hypertension 1999   Hypertriglyceridemia 2017   Migraine    Prediabetes 2017   with lifestyle change brought A1C to normal range 2018    Past Surgical History:  Procedure Laterality Date   APPENDECTOMY  2002   CESAREAN SECTION  1999, 2004   HERNIA REPAIR Bilateral 1969   LAPAROSCOPIC ABDOMINAL EXPLORATION  1997   For Endometriosis    Family History  Problem Relation Age of Onset   Heart disease Mother        died from CHF   Hypertension Mother    Parkinson's disease Mother    Diabetes Father    Heart disease Father    Hypertension Father    Heart disease Maternal Grandmother    Hypertension Maternal Grandmother    Heart disease Maternal Grandfather    Hypertension Maternal Grandfather    Stroke Maternal Grandfather    Stroke Paternal Grandfather    Autism spectrum disorder Son        High functioning   Autism spectrum disorder Son        High functioning   Migraines Son        hemiplegia with migraines    Social History   Socioeconomic History   Marital status: Divorced    Spouse name: Not on file   Number of children: 2   Years of education: 14   Highest education level: Tax adviser degree: occupational, Scientist, product/process development, or vocational program  Occupational History    Occupation: Architect    Comment: [employer] [address].  Tobacco Use   Smoking status: Never   Smokeless tobacco: Never  Vaping Use   Vaping status: Never Used  Substance and Sexual Activity   Alcohol use: No    Alcohol/week: 0.0 standard drinks of alcohol   Drug use: No   Sexual activity: Not Currently    Birth control/protection: Surgical  Other Topics Concern   Not on file  Social History Narrative   Lives with her two sons here in [place]   Social Drivers of Health   Financial Resource Strain: Medium Risk (07/26/2017)   Overall Financial Resource Strain (CARDIA)    Difficulty of Paying Living Expenses: Somewhat hard  Food Insecurity: Low Risk  (02/06/2023)   Received from [facility]   Hunger Vital Sign    Worried About Running Out of Food in the Last Year: Never true    Ran Out of Food in the Last Year: Never true  Transportation Needs: No Transportation Needs (02/06/2023)   Received from [facility]    In the past 12 months, has lack of reliable transportation kept you from medical appointments, meetings, work or from getting things needed for daily living? : No  Physical Activity: Sufficiently Active (07/26/2017)   Exercise Vital Sign  Days of Exercise per Week: 7 days    Minutes of Exercise per Session: 30 min  Stress: Stress Concern Present (07/26/2017)   [employer] of Occupational Health - Occupational Stress Questionnaire    Feeling of Stress : To some extent  Social Connections: Unknown (06/02/2021)   [facility]   Social Network    Social Network: Not on file  Intimate Partner Violence: Unknown (04/24/2021)   Received from [facility]   HITS    Physically Hurt: Not on file    Insult or Talk Down To: Not on file    Threaten Physical Harm: Not on file    Scream or Curse: Not on file    Outpatient Encounter Medications as of 05/16/2023  Medication Sig   aspirin EC 81 MG tablet Take 81 mg by mouth  daily.   DULoxetine  (CYMBALTA ) 30 MG capsule Take 1 capsule (30 mg total) by mouth daily.   hydrochlorothiazide  (HYDRODIURIL ) 25 MG tablet Take 1 tablet (25 mg total) by mouth daily.   lisinopril  (PRINIVIL ,ZESTRIL ) 10 MG tablet Take 1 tablet (10 mg total) by mouth daily.   No facility-administered encounter medications on file as of 05/16/2023.    Allergies  Allergen Reactions   Chocolate Dermatitis    Review of Systems *** PE; There were no vitals taken for this visit.  Physical Exam There is no height or weight on file to calculate BMI.  *** Pertinent labs:  See hpi  ASSESSMENT AND PLAN:   No problem-specific Assessment & Plan notes found for this encounter.  Health maintenance exam: Reviewed age and gender appropriate health maintenance issues (prudent diet, regular exercise, health risks of tobacco and excessive alcohol, use of seatbelts, fire alarms in home, use of sunscreen).  Also reviewed age and gender appropriate health screening as well as vaccine recommendations. Vaccines: Labs: Cervical ca screening: Breast ca screening: Colon ca screening:  An After Visit Summary was printed and given to the patient.  No follow-ups on file.  Signed:  Arletha Lady, MD           05/16/2023

## 2023-07-29 ENCOUNTER — Ambulatory Visit: Admitting: Internal Medicine
